# Patient Record
Sex: Female | Born: 1965
Health system: Southern US, Community
[De-identification: ages and names within clinical notes are randomized; demographics above are authoritative.]

## PROBLEM LIST (undated history)

## (undated) DIAGNOSIS — E7800 Pure hypercholesterolemia, unspecified: Secondary | ICD-10-CM

## (undated) DIAGNOSIS — I10 Essential (primary) hypertension: Secondary | ICD-10-CM

## (undated) DIAGNOSIS — M5126 Other intervertebral disc displacement, lumbar region: Secondary | ICD-10-CM

## (undated) DIAGNOSIS — I872 Venous insufficiency (chronic) (peripheral): Secondary | ICD-10-CM

## (undated) DIAGNOSIS — M5136 Other intervertebral disc degeneration, lumbar region: Secondary | ICD-10-CM

## (undated) DIAGNOSIS — E785 Hyperlipidemia, unspecified: Secondary | ICD-10-CM

## (undated) DIAGNOSIS — K219 Gastro-esophageal reflux disease without esophagitis: Secondary | ICD-10-CM

## (undated) DIAGNOSIS — M51369 Other intervertebral disc degeneration, lumbar region without mention of lumbar back pain or lower extremity pain: Secondary | ICD-10-CM

## (undated) DIAGNOSIS — J45909 Unspecified asthma, uncomplicated: Secondary | ICD-10-CM

## (undated) DIAGNOSIS — Z8739 Personal history of other diseases of the musculoskeletal system and connective tissue: Secondary | ICD-10-CM

## (undated) DIAGNOSIS — D219 Benign neoplasm of connective and other soft tissue, unspecified: Secondary | ICD-10-CM

## (undated) HISTORY — DX: Morbid (severe) obesity due to excess calories: E66.01

## (undated) HISTORY — DX: Hyperlipidemia, unspecified: E78.5

## (undated) HISTORY — DX: Pure hypercholesterolemia, unspecified: E78.00

## (undated) HISTORY — DX: Unspecified asthma, uncomplicated: J45.909

## (undated) HISTORY — DX: Essential (primary) hypertension: I10

## (undated) HISTORY — DX: Personal history of other diseases of the musculoskeletal system and connective tissue: Z87.39

## (undated) HISTORY — DX: Other intervertebral disc degeneration, lumbar region: M51.36

## (undated) HISTORY — PX: TONSILLECTOMY: SUR1361

## (undated) HISTORY — DX: Venous insufficiency (chronic) (peripheral): I87.2

## (undated) HISTORY — DX: Other intervertebral disc displacement, lumbar region: M51.26

## (undated) HISTORY — DX: Benign neoplasm of connective and other soft tissue, unspecified: D21.9

## (undated) HISTORY — DX: Other intervertebral disc degeneration, lumbar region without mention of lumbar back pain or lower extremity pain: M51.369

---

## 1998-12-04 ENCOUNTER — Inpatient Hospital Stay (HOSPITAL_COMMUNITY): Admission: AD | Admit: 1998-12-04 | Discharge: 1998-12-06 | Payer: Self-pay | Admitting: Obstetrics & Gynecology

## 1999-01-27 ENCOUNTER — Other Ambulatory Visit: Admission: RE | Admit: 1999-01-27 | Discharge: 1999-01-27 | Payer: Self-pay | Admitting: Obstetrics & Gynecology

## 2000-02-14 ENCOUNTER — Other Ambulatory Visit: Admission: RE | Admit: 2000-02-14 | Discharge: 2000-02-14 | Payer: Self-pay | Admitting: Obstetrics & Gynecology

## 2000-11-22 ENCOUNTER — Other Ambulatory Visit: Admission: RE | Admit: 2000-11-22 | Discharge: 2000-11-22 | Payer: Self-pay | Admitting: Obstetrics & Gynecology

## 2001-06-12 ENCOUNTER — Encounter (INDEPENDENT_AMBULATORY_CARE_PROVIDER_SITE_OTHER): Payer: Self-pay | Admitting: Specialist

## 2001-06-12 ENCOUNTER — Inpatient Hospital Stay (HOSPITAL_COMMUNITY): Admission: AD | Admit: 2001-06-12 | Discharge: 2001-06-15 | Payer: Self-pay | Admitting: Obstetrics & Gynecology

## 2001-07-19 ENCOUNTER — Other Ambulatory Visit: Admission: RE | Admit: 2001-07-19 | Discharge: 2001-07-19 | Payer: Self-pay | Admitting: Obstetrics & Gynecology

## 2002-07-30 ENCOUNTER — Other Ambulatory Visit: Admission: RE | Admit: 2002-07-30 | Discharge: 2002-07-30 | Payer: Self-pay | Admitting: Family Medicine

## 2003-10-08 ENCOUNTER — Other Ambulatory Visit: Admission: RE | Admit: 2003-10-08 | Discharge: 2003-10-08 | Payer: Self-pay | Admitting: Obstetrics and Gynecology

## 2004-03-24 ENCOUNTER — Ambulatory Visit (HOSPITAL_COMMUNITY): Admission: RE | Admit: 2004-03-24 | Discharge: 2004-03-24 | Payer: Self-pay | Admitting: Obstetrics and Gynecology

## 2004-04-08 ENCOUNTER — Ambulatory Visit (HOSPITAL_COMMUNITY): Admission: RE | Admit: 2004-04-08 | Discharge: 2004-04-08 | Payer: Self-pay | Admitting: Obstetrics and Gynecology

## 2004-11-15 ENCOUNTER — Other Ambulatory Visit: Admission: RE | Admit: 2004-11-15 | Discharge: 2004-11-15 | Payer: Self-pay | Admitting: Obstetrics and Gynecology

## 2005-12-01 ENCOUNTER — Ambulatory Visit (HOSPITAL_COMMUNITY): Admission: RE | Admit: 2005-12-01 | Discharge: 2005-12-01 | Payer: Self-pay | Admitting: Obstetrics and Gynecology

## 2005-12-27 ENCOUNTER — Other Ambulatory Visit: Admission: RE | Admit: 2005-12-27 | Discharge: 2005-12-27 | Payer: Self-pay | Admitting: Obstetrics and Gynecology

## 2006-02-16 ENCOUNTER — Encounter (INDEPENDENT_AMBULATORY_CARE_PROVIDER_SITE_OTHER): Payer: Self-pay | Admitting: Specialist

## 2006-02-16 ENCOUNTER — Ambulatory Visit (HOSPITAL_COMMUNITY): Admission: RE | Admit: 2006-02-16 | Discharge: 2006-02-16 | Payer: Self-pay | Admitting: Obstetrics and Gynecology

## 2006-12-20 ENCOUNTER — Encounter: Admission: RE | Admit: 2006-12-20 | Discharge: 2006-12-20 | Payer: Self-pay | Admitting: Obstetrics and Gynecology

## 2008-01-01 ENCOUNTER — Ambulatory Visit (HOSPITAL_COMMUNITY): Admission: RE | Admit: 2008-01-01 | Discharge: 2008-01-01 | Payer: Self-pay | Admitting: Obstetrics and Gynecology

## 2009-03-10 ENCOUNTER — Ambulatory Visit (HOSPITAL_COMMUNITY): Admission: RE | Admit: 2009-03-10 | Discharge: 2009-03-10 | Payer: Self-pay | Admitting: Obstetrics and Gynecology

## 2010-03-23 ENCOUNTER — Ambulatory Visit (HOSPITAL_COMMUNITY)
Admission: RE | Admit: 2010-03-23 | Discharge: 2010-03-23 | Payer: Self-pay | Source: Home / Self Care | Attending: Obstetrics and Gynecology | Admitting: Obstetrics and Gynecology

## 2010-04-06 ENCOUNTER — Encounter
Admission: RE | Admit: 2010-04-06 | Discharge: 2010-04-06 | Payer: Self-pay | Source: Home / Self Care | Attending: Obstetrics and Gynecology | Admitting: Obstetrics and Gynecology

## 2010-08-20 NOTE — Discharge Summary (Signed)
[facility]  Patient:    Natalie Schmitt, Natalie Schmitt Visit Number: [id number] MRN: [id number]          Service Type: OBS Location: 910A [id number] Attending Physician:  Minette Headland Dictated by:   Danie Chandler, R.N. Admit Date:  06/12/2001 Discharge Date: 06/15/2001                             Discharge Summary  ADMITTING DIAGNOSES:          1. Intrauterine pregnancy at term.                               2. Complicated prenatal course.                               3. Surgically scarred uterus from previous                                  cesarean section x2.                               4. Multiparity; request for surgical                                  sterilization.  DISCHARGE DIAGNOSES:          1. Intrauterine pregnancy at term.                               2. Complicated prenatal course.                               3. Surgically scarred uterus from previous                                  cesarean section x2.                               4. Multiparity, request for surgical                                  sterilization.  PROCEDURE:                    On June 12, 2001, repeat low transverse                               cervical cesarean section with delivery of a                               viable _male infant with Apgars of 9 at  one minute and 9 at five minutes and an arterial                               cord pH of 7.33.  SECONDARY PROCEDURES:         Bilateral tubal ligation and myomectomy of an                               approximately 3 cm myoma in the lower uterine                               segment.  REASON FOR ADMISSION:         Please see dictated H&P.  HOSPITAL COURSE:              The patient was taken to the operating room and underwent the above-named procedure without complication.  This was productive of a viable _female infant with Apgars of 9 at one minute and 9 at five  minutes and an arterial cord pH of 7.33.  Postoperatively on day #1 the patients hemoglobin was 9.6, hematocrit 28.4, and white blood cell count 10.0.  On postoperative day #2 the patient was doing well, she was ambulating well without difficulty, tolerating a regular diet and had good return of bowel function.  On postoperative day #3 she was discharged home, she was ambulating well and had good pain control.  CONDITION ON DISCHARGE:       Good.  DIET:                         Regular as tolerated.  ACTIVITY:                     No heavy lifting, no driving, no vaginal entry.  FOLLOWUP:                     She is to follow up in the office in 1-2 weeks for incision check.  DISCHARGE INSTRUCTIONS:       She is to call for temperature greater than 100 degrees, persistent nausea/vomiting, heavy vaginal bleeding and/or redness or drainage from the incision site.  DISCHARGE MEDICATIONS:        1. Prenatal vitamin one p.o. q.d.                               2. Percocet 5 mg #20 as directed by MD. Dictated by:   Danie Chandler, R.N. Attending Physician:  Minette Headland DD:  06/15/01 TD:  06/16/01 Job: [id number]

## 2010-08-20 NOTE — Op Note (Signed)
[facility]  Patient:    Natalie Schmitt, Natalie Schmitt Visit Number: [id number] MRN: [id number]          Service Type: Attending:  Freddy Finner, M.D. Dictated by:   Freddy Finner, M.D. Proc. Date: 06/12/01                             Operative Report  PREOPERATIVE DIAGNOSES:       Intrauterine pregnancy at term, uncomplicated prenatal course, surgically scarred uterus from previous cesarean section x2, multiparity, request for surgical sterilization.  OPERATIVE PROCEDURE:          Repeat low transverse cervical cesarean section with delivery of viable female infant.  Apgars of 9 and 9.  Birth weight 9 pounds 7 ounces.  Cord pH 7.33.  Secondary procedures:  Bilateral tubal ligation and myomectomy of an approximately 3 cm myoma in the lower uterine segment.  SURGEON:                      Freddy Finner, M.D.  ANESTHESIA:                   General.  INTRAOPERATIVE COMPLICATIONS: None.  ESTIMATED BLOOD LOSS:         600-800 cc.  PROCEDURE:                    Patient is a 45 year old white married female who is now at term with a third pregnancy.  She has previously delivered by cesarean on two occasions.  She is admitted now for repeat cesarean delivery.  She was admitted on the morning of surgery, brought to the operating room, there placed under adequate spinal anesthesia, placed in the dorsal recumbent position with elevation of right hip about 15 degrees.  Abdomen was prepped with Betadine solution.  Foley catheter was placed using sterile technique. Sterile drapes were applied.  ______ a lower abdominal transverse incision was made carried sharply down the fascia.  Fascia was entered sharply and extended to the extent of the skin incision.  Rectus sheath was developed superiorly and inferiorly with blunt and sharp dissection.  Rectus muscles were divided in the midline.  Peritoneal was entered in the process of opening the rectus muscles.  This was  extended bluntly and sharply to the extent of the skin incision.  Bladder blade was placed.  ______ peritoneum umbilical lower uterine segment a small area of the bladder was dissected free. Transverse incision ______ in the lower segment extended bluntly in a transverse regimen.  Amniotomy produced clear fluid.  A vacuum extractor was then required for easy delivery of a viable female.  Apgars and pH are noted above.  The placenta was removed and the cavity confirmed evacuated by manual exploration of the uterine cavity.  Uterus was delivered through the vaginal introitus.  There were a total of three visible myomas.  Two were very small and subserosal, one posteriorly, and one anteriorly on the left lower uterine segment.  A large 3 cm myoma was located just above the incision in the lower segment.  It was elected to excise it sharply.  The uterine incision was then closed in a running layer of 0 Monocryl in a locking pattern.  An interrupted was required at the left margin for complete hemostasis.  Mid portion of each tube was then elevated, double ligated with free ties of 0 plain, and approximately  1.5 cm of tube excised.  The distal tips of the tubes distal tube ties were fulgurated with the Bovie.  The uterus was then placed back in the abdominal cavity.  Please note the ovaries were completely normal.  The right fallopian tube was avulsed at the tie.  Replacement of the uterus within the abdominal cavity and each of these was then religated ______ with 0 plain ______ suture ligature of 0 Monocryl.  Bovie of the mesosalpinx produced complete hemostasis.  Irrigation was carried out.  All packs, needles, and instruments removed.  Counts were correct.  Abdominal incision was then closed in layers.  Running 0 Monocryl was used for ______  and to reapproximate the rectus muscles.  Fascia was closed with running 0 PDS.  Skin was closed with wide skin staples and covered in Steri-Strips.   Estimated intraoperative blood loss was 600-800 cc.  Intraoperative complications:  None.  Patient was taken to recovery in good condition. Dictated by:   Freddy Finner, M.D. Attending:  Freddy Finner, M.D. DD:  06/12/01 TD:  06/12/01 Job: [id number]

## 2010-08-20 NOTE — H&P (Signed)
[facility]  Patient:    Natalie Schmitt, Natalie Schmitt Visit Number: [id number] MRN: [id number]          Service Type: Attending:  Freddy Finner, M.D. Dictated by:   Freddy Finner, M.D. Adm. Date:  06/12/01                           History and Physical  ADMITTING DIAGNOSES:          1. Intrauterine pregnancy at term.                               2. Surgically scarred uterus from two previous                                  cesareans.                               3. Multiparity and request for surgical                                  sterilization.  HISTORY OF PRESENT ILLNESS:   The patient is a 45 year old white married female, gravida 4, para 2, both by cesarean, who is now at term with an uncomplicated prenatal course and is admitted now for cesarean delivery.  This will be her third viable pregnancy, and she has requested permanent surgical intervention.  REVIEW OF SYSTEMS:            Her current review of systems is negative. There are no cardiopulmonary, GI, or GU complaints.  PAST MEDICAL HISTORY:         Recorded in detail in the prenatal summary.  It will not be repeated at this time.  PHYSICAL EXAMINATION:  GENERAL:                      The patient is a well-nourished, well-developed, alert, pleasant white female in no acute distress.  VITAL SIGNS:                  Her blood pressure in the office was 124/70.  CHEST:                        Clear to auscultation.  HEART:                        Normal sinus rhythm with murmur, rub or gallop.  ABDOMEN:                      Gravid, estimated fetal weight of 7.5-8 pounds. Positive fetal heart tone in the left lower quadrant.  CERVIX:                       Closed.  EXTREMITIES:                  Without clubbing, cyanosis, or edema.  ASSESSMENT:                   Uncomplicated prenatal course at term for cesarean birth.  PLAN:  1. Repeat low transverse cesarean  section.                               2. Bilateral tubal ligation. Dictated by:   Freddy Finner, M.D. Attending:  Freddy Finner, M.D. DD:  06/11/01 TD:  06/11/01 Job: [id number]

## 2010-08-20 NOTE — Op Note (Signed)
NAME:  Natalie Schmitt, Natalie Schmitt               ACCOUNT NO.:  [id number]   MEDICAL RECORD NO.:  [id number]          PATIENT TYPE:  AMB   LOCATION:  SDC                           FACILITY:  [facility]   PHYSICIAN:  Hal Morales, M.D.DATE OF BIRTH:  14-Mar-1966   DATE OF PROCEDURE:  02/16/2006  DATE OF DISCHARGE:                               OPERATIVE REPORT   PREOPERATIVE DIAGNOSES:  1. Metrorrhagia.  2. History of fibroids.  3. Questionable polyp on ultrasound.   POSTOPERATIVE DIAGNOSES:  Metrorrhagia and history of fibroids.   PROCEDURE:  Hysteroscopy D&C.   SURGEON:  Dr. Dierdre Forth   ANESTHESIA:  General LMA.   SPECIMENS TO PATHOLOGY:  Endometrial curettings.   ESTIMATED BLOOD LOSS:  Less than 10 mL.   COMPLICATIONS:  None.   FINDINGS:  The uterus sounded to approximately 9 cm and was markedly  retroverted.  The endometrial cavity appeared slightly atrophic with 1  of fluffy endometrium at the fundus.  There were no actual fibroids  noted submucosally and no clearly identifiable polypoid lesion other  than the fluffy endometrium.   PROCEDURE:  The patient was taken to the operating room after  appropriate identification and placed on the operating table.  After the  attainment of adequate general anesthesia, she was placed in lithotomy  position.  The perineum and vagina were prepped with multiple layers of  Betadine and bladder emptied with an in-and-out catheter.  The perineum  was draped as a sterile field.  A Graves speculum was placed in the  vagina and a paracervical block achieved with a total of 10 mL of 2%  Xylocaine in the 5 and 7 o'clock positions.  A single-tooth tenaculum  was placed on the anterior cervix.  The uterus was sounded.  The cervix  was dilated to accommodate the diagnostic hysteroscope which was then  used to evaluate all quadrants of the uterus.  The endometrium was  sharply curetted in all quadrants and the tissue removed.  Review of the  endometrial  cavity revealed no further lesions, and  all instruments were removed from the vagina.  The patient was then  awakened from general anesthesia and taken to the recovery room in  satisfactory condition having tolerated the procedure well with sponge  and instrument counts correct.  Fluid deficit was 100 mL.      Hal Morales, M.D.  Electronically Signed     VPH/MEDQ  D:  02/16/2006  T:  02/16/2006  Job:  [id number]

## 2011-03-15 ENCOUNTER — Other Ambulatory Visit (HOSPITAL_COMMUNITY): Payer: Self-pay | Admitting: Obstetrics and Gynecology

## 2011-03-15 DIAGNOSIS — Z1231 Encounter for screening mammogram for malignant neoplasm of breast: Secondary | ICD-10-CM

## 2011-04-05 DIAGNOSIS — I10 Essential (primary) hypertension: Secondary | ICD-10-CM

## 2011-04-05 HISTORY — DX: Essential (primary) hypertension: I10

## 2011-04-21 ENCOUNTER — Ambulatory Visit (HOSPITAL_COMMUNITY)
Admission: RE | Admit: 2011-04-21 | Discharge: 2011-04-21 | Disposition: A | Discharge status: Home / Self Care | Payer: BC Managed Care – PPO | Source: Ambulatory Visit | Attending: Obstetrics and Gynecology | Admitting: Obstetrics and Gynecology

## 2011-04-21 DIAGNOSIS — Z1231 Encounter for screening mammogram for malignant neoplasm of breast: Secondary | ICD-10-CM | POA: Insufficient documentation

## 2012-03-21 ENCOUNTER — Ambulatory Visit: Payer: BC Managed Care – PPO | Admitting: Obstetrics and Gynecology

## 2012-03-27 ENCOUNTER — Other Ambulatory Visit: Payer: Self-pay | Admitting: Obstetrics and Gynecology

## 2012-03-27 DIAGNOSIS — Z1231 Encounter for screening mammogram for malignant neoplasm of breast: Secondary | ICD-10-CM

## 2012-04-02 ENCOUNTER — Ambulatory Visit (INDEPENDENT_AMBULATORY_CARE_PROVIDER_SITE_OTHER): Payer: BC Managed Care – PPO | Admitting: Obstetrics and Gynecology

## 2012-04-02 ENCOUNTER — Encounter: Payer: Self-pay | Admitting: Obstetrics and Gynecology

## 2012-04-02 VITALS — BP 110/80 | HR 72 | Resp 16 | Ht 64.0 in | Wt 208.0 lb

## 2012-04-02 DIAGNOSIS — D259 Leiomyoma of uterus, unspecified: Secondary | ICD-10-CM

## 2012-04-02 DIAGNOSIS — Z01419 Encounter for gynecological examination (general) (routine) without abnormal findings: Secondary | ICD-10-CM

## 2012-04-02 DIAGNOSIS — D219 Benign neoplasm of connective and other soft tissue, unspecified: Secondary | ICD-10-CM

## 2012-04-02 DIAGNOSIS — Z8742 Personal history of other diseases of the female genital tract: Secondary | ICD-10-CM

## 2012-04-02 DIAGNOSIS — Z124 Encounter for screening for malignant neoplasm of cervix: Secondary | ICD-10-CM

## 2012-04-02 NOTE — Progress Notes (Signed)
Last Pap: 02/20/2009  WNL: Yes Regular Periods:yes Contraception: BTL  Monthly Breast exam:no Tetanus<66yrs:yes Nl.Bladder Function:yes Daily BMs:yes Healthy Diet:no Calcium:no Mammogram:yes Date of Mammogram:04/21/2011 Exercise:yes Have often Exercise: 4 x wkly Seatbelt: yes Abuse at home: no Stressful work:yes Sigmoid-colonoscopy: None Bone Density: No PCP: D. Swayne Change in PMH: None Change in QMV:HQIO  Subjective:    Natalie Schmitt is a 46 y.o. female,  who presents for an annual exam. C/o right arm pain and weakness since late Nov.    History   Social History  . Marital Status: Married    Spouse Name: N/A    Number of Children: N/A  . Years of Education: N/A   Social History Main Topics  . Smoking status: Former Smoker    Types: Cigarettes    Quit date: 04/03/1991  . Smokeless tobacco: None  . Alcohol Use: Yes  . Drug Use: No  . Sexually Active: Yes     Comment: BTL   Other Topics Concern  . None   Social History Narrative  . None    Menstrual cycle:   LMP: Patient's last menstrual period was 02/16/2012.           Cycle: Cycles over the last year have been erratic from every 18-45 days, some very heavy, most recently in 11/13, and with cramps and clots.  Some hot flashes and insomnia.  The following portions of the patient's history were reviewed and updated as appropriate: allergies, current medications, past family history, past medical history, past social history, past surgical history and problem list.  Review of Systems Pertinent items are noted in HPI. Breast:Negative for breast lump,nipple discharge or nipple retraction Gastrointestinal: Negative for abdominal pain, change in bowel habits or rectal bleeding Urinary:negative   Objective:    BP 110/80  Pulse 72  Resp 16  Ht 5\' 4"  (1.626 m)  Wt 208 lb (94.348 kg)  BMI 35.70 kg/m2  LMP 02/16/2012    Weight:  Wt Readings from Last 1 Encounters:  04/02/12 208 lb (94.348 kg)   BMI: Body mass index is 35.70 kg/(m^2).  General Appearance: Alert, appropriate appearance for age. No acute distress HEENT: Grossly normal Neck / Thyroid: Supple, no masses, nodes or enlargement Lungs: clear to auscultation bilaterally Back: No CVA tenderness Breast Exam: No masses or nodes.No dimpling, nipple retraction or discharge. Cardiovascular: Regular rate and rhythm. S1, S2, no murmur Gastrointestinal: Soft, non-tender, no masses or organomegaly Pelvic Exam: External genitalia: normal general appearance Vaginal: normal rugae Cervix: normal appearance Adnexa: No masses noted Uterus: irregular enlargement  About 10 weeks size posterior and mobile Exam limited by body habitus Rectovaginal: normal rectal, no masses Lymphatic Exam: Non-palpable nodes in neck, clavicular, axillary, or inguinal regions Skin: no rash or abnormalities Neurologic: Normal gait and speech, no tremor  Psychiatric: Alert and oriented, appropriate affect.   Wet Prep:not applicable Urinalysis:not applicable UPT: Not done   Assessment:  Perimenopausal bleeding pattern  Fibroids  Episodic menorrhagia and dysmenorrhea   Plan:    mammogram pap smear return annually  STD screening: declined Contraception:bilateral tubal ligation Call for endometrial biopsy if menses less than 21 days cycle. Ibuprofen OTC 600mg  q 6h prm cramps or heavy bleeding Referral to orthopedist for right arm  Dierdre Forth MD

## 2012-04-05 LAB — PAP IG AND HPV HIGH-RISK: HPV DNA High Risk: NOT DETECTED

## 2012-04-24 ENCOUNTER — Ambulatory Visit (HOSPITAL_COMMUNITY)
Admission: RE | Admit: 2012-04-24 | Discharge: 2012-04-24 | Disposition: A | Discharge status: Home / Self Care | Payer: BC Managed Care – PPO | Source: Ambulatory Visit | Attending: Obstetrics and Gynecology | Admitting: Obstetrics and Gynecology

## 2012-04-24 DIAGNOSIS — Z1231 Encounter for screening mammogram for malignant neoplasm of breast: Secondary | ICD-10-CM

## 2012-04-29 ENCOUNTER — Encounter: Payer: Self-pay | Admitting: Obstetrics and Gynecology

## 2012-04-29 DIAGNOSIS — R9230 Dense breasts, unspecified: Secondary | ICD-10-CM | POA: Insufficient documentation

## 2012-04-29 DIAGNOSIS — R922 Inconclusive mammogram: Secondary | ICD-10-CM | POA: Insufficient documentation

## 2013-03-12 ENCOUNTER — Ambulatory Visit (INDEPENDENT_AMBULATORY_CARE_PROVIDER_SITE_OTHER): Payer: BC Managed Care – PPO

## 2013-03-12 ENCOUNTER — Ambulatory Visit (INDEPENDENT_AMBULATORY_CARE_PROVIDER_SITE_OTHER): Payer: BC Managed Care – PPO | Admitting: Podiatry

## 2013-03-12 DIAGNOSIS — M79672 Pain in left foot: Secondary | ICD-10-CM

## 2013-03-12 DIAGNOSIS — M79609 Pain in unspecified limb: Secondary | ICD-10-CM

## 2013-03-12 DIAGNOSIS — M722 Plantar fascial fibromatosis: Secondary | ICD-10-CM

## 2013-03-12 MED ORDER — MELOXICAM 15 MG PO TABS: 15.0000 mg | ORAL_TABLET | Freq: Every day | ORAL | Status: DC

## 2013-03-12 MED ORDER — METHYLPREDNISOLONE (PAK) 4 MG PO TABS: ORAL_TABLET | ORAL | Status: DC

## 2013-03-12 NOTE — Patient Instructions (Addendum)
Plantar Fasciitis (Heel Spur Syndrome) with Rehab The plantar fascia is a fibrous, ligament-like, soft-tissue structure that spans the bottom of the foot. Plantar fasciitis is a condition that causes pain in the foot due to inflammation of the tissue. SYMPTOMS   Pain and tenderness on the underneath side of the foot.  Pain that worsens with standing or walking. CAUSES  Plantar fasciitis is caused by irritation and injury to the plantar fascia on the underneath side of the foot. Common mechanisms of injury include:  Direct trauma to bottom of the foot.  Damage to a small nerve that runs under the foot where the main fascia attaches to the heel bone.  Stress placed on the plantar fascia due to bone spurs. RISK INCREASES WITH:   Activities that place stress on the plantar fascia (running, jumping, pivoting, or cutting).  Poor strength and flexibility.  Improperly fitted shoes.  Tight calf muscles.  Flat feet.  Failure to warm-up properly before activity.  Obesity. PREVENTION  Warm up and stretch properly before activity.  Allow for adequate recovery between workouts.  Maintain physical fitness:  Strength, flexibility, and endurance.  Cardiovascular fitness.  Maintain a health body weight.  Avoid stress on the plantar fascia.  Wear properly fitted shoes, including arch supports for individuals who have flat feet. PROGNOSIS  If treated properly, then the symptoms of plantar fasciitis usually resolve without surgery. However, occasionally surgery is necessary. RELATED COMPLICATIONS   Recurrent symptoms that may result in a chronic condition.  Problems of the lower back that are caused by compensating for the injury, such as limping.  Pain or weakness of the foot during push-off following surgery.  Chronic inflammation, scarring, and partial or complete fascia tear, occurring more often from repeated injections. TREATMENT  Treatment initially involves the use of  ice and medication to help reduce pain and inflammation. The use of strengthening and stretching exercises may help reduce pain with activity, especially stretches of the Achilles tendon. These exercises may be performed at home or with a therapist. Your caregiver may recommend that you use heel cups of arch supports to help reduce stress on the plantar fascia. Occasionally, corticosteroid injections are given to reduce inflammation. If symptoms persist for greater than 6 months despite non-surgical (conservative), then surgery may be recommended.  MEDICATION   If pain medication is necessary, then nonsteroidal anti-inflammatory medications, such as aspirin and ibuprofen, or other minor pain relievers, such as acetaminophen, are often recommended.  Do not take pain medication within 7 days before surgery.  Prescription pain relievers may be given if deemed necessary by your caregiver. Use only as directed and only as much as you need.  Corticosteroid injections may be given by your caregiver. These injections should be reserved for the most serious cases, because they may only be given a certain number of times. HEAT AND COLD  Cold treatment (icing) relieves pain and reduces inflammation. Cold treatment should be applied for 10 to 15 minutes every 2 to 3 hours for inflammation and pain and immediately after any activity that aggravates your symptoms. Use ice packs or massage the area with a piece of ice (ice massage).  Heat treatment may be used prior to performing the stretching and strengthening activities prescribed by your caregiver, physical therapist, or athletic trainer. Use a heat pack or soak the injury in warm water. SEEK IMMEDIATE MEDICAL CARE IF:  Treatment seems to offer no benefit, or the condition worsens.  Any medications produce adverse side effects. EXERCISES RANGE   OF MOTION (ROM) AND STRETCHING EXERCISES - Plantar Fasciitis (Heel Spur Syndrome) These exercises may help you  when beginning to rehabilitate your injury. Your symptoms may resolve with or without further involvement from your physician, physical therapist or athletic trainer. While completing these exercises, remember:   Restoring tissue flexibility helps normal motion to return to the joints. This allows healthier, less painful movement and activity.  An effective stretch should be held for at least 30 seconds.  A stretch should never be painful. You should only feel a gentle lengthening or release in the stretched tissue. RANGE OF MOTION - Toe Extension, Flexion  Sit with your right / left leg crossed over your opposite knee.  Grasp your toes and gently pull them back toward the top of your foot. You should feel a stretch on the bottom of your toes and/or foot.  Hold this stretch for __________ seconds.  Now, gently pull your toes toward the bottom of your foot. You should feel a stretch on the top of your toes and or foot.  Hold this stretch for __________ seconds. Repeat __________ times. Complete this stretch __________ times per day.  RANGE OF MOTION - Ankle Dorsiflexion, Active Assisted  Remove shoes and sit on a chair that is preferably not on a carpeted surface.  Place right / left foot under knee. Extend your opposite leg for support.  Keeping your heel down, slide your right / left foot back toward the chair until you feel a stretch at your ankle or calf. If you do not feel a stretch, slide your bottom forward to the edge of the chair, while still keeping your heel down.  Hold this stretch for __________ seconds. Repeat __________ times. Complete this stretch __________ times per day.  STRETCH  Gastroc, Standing  Place hands on wall.  Extend right / left leg, keeping the front knee somewhat bent.  Slightly point your toes inward on your back foot.  Keeping your right / left heel on the floor and your knee straight, shift your weight toward the wall, not allowing your back to  arch.  You should feel a gentle stretch in the right / left calf. Hold this position for __________ seconds. Repeat __________ times. Complete this stretch __________ times per day. STRETCH  Soleus, Standing  Place hands on wall.  Extend right / left leg, keeping the other knee somewhat bent.  Slightly point your toes inward on your back foot.  Keep your right / left heel on the floor, bend your back knee, and slightly shift your weight over the back leg so that you feel a gentle stretch deep in your back calf.  Hold this position for __________ seconds. Repeat __________ times. Complete this stretch __________ times per day. STRETCH  Gastrocsoleus, Standing  Note: This exercise can place a lot of stress on your foot and ankle. Please complete this exercise only if specifically instructed by your caregiver.   Place the ball of your right / left foot on a step, keeping your other foot firmly on the same step.  Hold on to the wall or a rail for balance.  Slowly lift your other foot, allowing your body weight to press your heel down over the edge of the step.  You should feel a stretch in your right / left calf.  Hold this position for __________ seconds.  Repeat this exercise with a slight bend in your right / left knee. Repeat __________ times. Complete this stretch __________ times per day.    STRENGTHENING EXERCISES - Plantar Fasciitis (Heel Spur Syndrome)  These exercises may help you when beginning to rehabilitate your injury. They may resolve your symptoms with or without further involvement from your physician, physical therapist or athletic trainer. While completing these exercises, remember:   Muscles can gain both the endurance and the strength needed for everyday activities through controlled exercises.  Complete these exercises as instructed by your physician, physical therapist or athletic trainer. Progress the resistance and repetitions only as guided. STRENGTH - Towel  Curls  Sit in a chair positioned on a non-carpeted surface.  Place your foot on a towel, keeping your heel on the floor.  Pull the towel toward your heel by only curling your toes. Keep your heel on the floor.  If instructed by your physician, physical therapist or athletic trainer, add ____________________ at the end of the towel. Repeat __________ times. Complete this exercise __________ times per day. STRENGTH - Ankle Inversion  Secure one end of a rubber exercise band/tubing to a fixed object (table, pole). Loop the other end around your foot just before your toes.  Place your fists between your knees. This will focus your strengthening at your ankle.  Slowly, pull your big toe up and in, making sure the band/tubing is positioned to resist the entire motion.  Hold this position for __________ seconds.  Have your muscles resist the band/tubing as it slowly pulls your foot back to the starting position. Repeat __________ times. Complete this exercises __________ times per day.  Document Released: 03/21/2005 Document Revised: 06/13/2011 Document Reviewed: 07/03/2008 [employer] Patient Information 2014 [employer]. Plantar Fasciitis Plantar fasciitis is a common condition that causes foot pain. It is soreness (inflammation) of the band of tough fibrous tissue on the bottom of the foot that runs from the heel bone (calcaneus) to the ball of the foot. The cause of this soreness may be from excessive standing, poor fitting shoes, running on hard surfaces, being overweight, having an abnormal walk, or overuse (this is common in runners) of the painful foot or feet. It is also common in aerobic exercise dancers and ballet dancers. SYMPTOMS  Most people with plantar fasciitis complain of:  Severe pain in the morning on the bottom of their foot especially when taking the first steps out of bed. This pain recedes after a few minutes of walking.  Severe pain is experienced also during walking  following a long period of inactivity.  Pain is worse when walking barefoot or up stairs DIAGNOSIS   Your caregiver will diagnose this condition by examining and feeling your foot.  Special tests such as X-rays of your foot, are usually not needed. PREVENTION   Consult a sports medicine professional before beginning a new exercise program.  Walking programs offer a good workout. With walking there is a lower chance of overuse injuries common to runners. There is less impact and less jarring of the joints.  Begin all new exercise programs slowly. If problems or pain develop, decrease the amount of time or distance until you are at a comfortable level.  Wear good shoes and replace them regularly.  Stretch your foot and the heel cords at the back of the ankle (Achilles tendon) both before and after exercise.  Run or exercise on even surfaces that are not hard. For example, asphalt is better than pavement.  Do not run barefoot on hard surfaces.  If using a treadmill, vary the incline.  Do not continue to workout if you have foot or joint   problems. Seek professional help if they do not improve. HOME CARE INSTRUCTIONS   Avoid activities that cause you pain until you recover.  Use ice or cold packs on the problem or painful areas after working out.  Only take over-the-counter or prescription medicines for pain, discomfort, or fever as directed by your caregiver.  Soft shoe inserts or athletic shoes with air or gel sole cushions may be helpful.  If problems continue or become more severe, consult a sports medicine caregiver or your own health care provider. Cortisone is a potent anti-inflammatory medication that may be injected into the painful area. You can discuss this treatment with your caregiver. MAKE SURE YOU:   Understand these instructions.  Will watch your condition.  Will get help right away if you are not doing well or get worse. Document Released: 12/14/2000 Document  Revised: 06/13/2011 Document Reviewed: 02/13/2008 [employer] Patient Information 2014 [employer].  

## 2013-03-13 NOTE — Progress Notes (Signed)
Natalie Schmitt states that her left foot is been bothering her since September when she fell on it she fell backwards on it pressing it to the ground. She thought it was her plantar fasciitis acting up.  Objective: Vital signs are stable she is alert and oriented x3. Pulses remain palpable left lower extremity. She has pain on palpation to the fourth fifth met cuboid articulation. She has pain on palpation medial continued tubercle of the left heel. Rate graphic evaluation does demonstrate soft tissue increase in density at the plantar fascial calcaneal insertion site but no other osseous abnormalities.  Assessment: Capsulitis associated with lateral compensatory syndrome secondary to plantar fasciitis left.  Plan: Steroids to be followed by nonsteroidals injected Kenalog and local anesthetic. We put her in a plantar fascial brace I will followup with her in one month

## 2013-04-08 ENCOUNTER — Other Ambulatory Visit: Payer: Self-pay | Admitting: Obstetrics and Gynecology

## 2013-04-08 DIAGNOSIS — Z1231 Encounter for screening mammogram for malignant neoplasm of breast: Secondary | ICD-10-CM

## 2013-04-09 ENCOUNTER — Ambulatory Visit (INDEPENDENT_AMBULATORY_CARE_PROVIDER_SITE_OTHER): Payer: BC Managed Care – PPO | Admitting: Podiatry

## 2013-04-09 ENCOUNTER — Encounter: Payer: Self-pay | Admitting: Podiatry

## 2013-04-09 VITALS — BP 172/96 | HR 97 | Resp 16

## 2013-04-09 DIAGNOSIS — M778 Other enthesopathies, not elsewhere classified: Secondary | ICD-10-CM

## 2013-04-09 DIAGNOSIS — M7750 Other enthesopathy of unspecified foot: Secondary | ICD-10-CM

## 2013-04-09 DIAGNOSIS — M779 Enthesopathy, unspecified: Principal | ICD-10-CM

## 2013-04-09 DIAGNOSIS — M722 Plantar fascial fibromatosis: Secondary | ICD-10-CM

## 2013-04-09 NOTE — Progress Notes (Signed)
She presents today for followup of her plantar fasciitis and capsulitis of the second metatarsophalangeal joint. She denies fever chills nausea vomiting muscle aches and pains. Continues all conservative therapies. States that she's considerably better.  Objective: Vital signs are stable she is alert and oriented x3 no pain on palpation medial continued tubercles.  Assessment: Well-healing inflammatory response with plantar fasciitis and capsulitis.  Plan: Continue all conservative therapies x1 month and discontinue gradually.

## 2013-04-30 ENCOUNTER — Ambulatory Visit (HOSPITAL_COMMUNITY)
Admission: RE | Admit: 2013-04-30 | Discharge: 2013-04-30 | Disposition: A | Discharge status: Home / Self Care | Payer: BC Managed Care – PPO | Source: Ambulatory Visit | Attending: Obstetrics and Gynecology | Admitting: Obstetrics and Gynecology

## 2013-04-30 DIAGNOSIS — Z1231 Encounter for screening mammogram for malignant neoplasm of breast: Secondary | ICD-10-CM | POA: Insufficient documentation

## 2013-10-30 ENCOUNTER — Other Ambulatory Visit: Payer: Self-pay | Admitting: Obstetrics and Gynecology

## 2014-01-22 ENCOUNTER — Other Ambulatory Visit: Payer: Self-pay | Admitting: Obstetrics and Gynecology

## 2014-02-04 ENCOUNTER — Other Ambulatory Visit: Payer: Self-pay | Admitting: Obstetrics and Gynecology

## 2014-02-04 NOTE — H&P (Signed)
Natalie Schmitt is a 48 y.o.  female P: 3-0-1-3 for hysterectomy because of worsening irregular vaginal bleeding.  For over 3 years patient describes her menstrual flow as "untimely" in that she never knows when to expect her menses. Her flow ranges from 1.5 days to 3 weeks with the need to change an ultra super tampon with pad every 2 hours due to clots.  She also reports cramping that is rated at 7/10 on a 10 point pain scale but finds relief with Advil 600 mg.  She denies post coital bleeding, dysparuenia, changes in bladder function or problems with bowel function.  An endometrial biopsy done July 2015 was benign and sono-hysterogram at that same time showed: a linear focus in the posterior endometrium 2.6 x 0.88 x 0.62 cm with a single vessel source, resembling a polyp; uterus: 6.62 x 6.19 x 5.38 cm (retroflexed) with an endometrium: 5.44 mm;  #2 intra-mural fibroids: anterior right: 1.2 cm and posterior: 1.2 cm.  A review of both medical and surgical management options were given to the patient however she desires definitive therapy in the form of hysterectomy.   Past Medical History  OB History: G:4;  P: 3-0-1-3;  C-sections 1992., 2001, and 2003  GYN History: menarche: 48 YO    LMP: 12/28/2013    Contracepton bilateral tubal ligation  The patient denies history of sexually transmitted disease.  Remote history of abnormal PAP smear.  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   ROS: Admits to contact lenses, back/feet/hand arthritis but denies headache, vision changes, nasal congestion, dysphagia, tinnitus, dizziness, hoarseness, cough,  chest pain, shortness of breath, nausea, vomiting, diarrhea,constipation,  urinary frequency, urgency  dysuria, hematuria, vaginitis symptoms, pelvic pain, swelling of joints,easy bruising,  myalgias,  skin rashes, unexplained weight loss and except as is mentioned in the history of present illness, patient's review of systems is otherwise negative.   Physical Exam  Bp: 130.90  P: 66   R: 16  Temperature: 98.4 degrees F orally  Weight: 219.5 lbs.  Height: 5'5"   BMI: 36.5  Neck: supple without masses or thyromegaly Lungs: clear to auscultation Heart: regular rate and rhythm Abdomen: soft, non-tender and no organomegaly Pelvic:EGBUS- wnl; vagina-normal rugae; uterus-normal size, cervix without lesions or motion tenderness; adnexae-no tenderness or masses Extremities:  no clubbing, cyanosis or edema   Assesment: Irregular Bleeding           Fibroids           Endometrial Mass   Disposition:  A discussion was held with patient regarding the indication for her procedure(s) along with the risks, which include but are not limited to: reaction to anesthesia, damage to adjacent organs, infection and excessive bleeding. The patient verbalized understanding of these risks and has consented to proceed with a Laparoscopically Assisted Vaginal  Hysterectomy with Bilateral Salpingectomy and Possible Total Abdominal Hysterectomy at [facility] on March 05, 2014 at 7:30 a.m.    CSN# [id number]   Kristofer Schaffert J. Florene Glen, PA-C  for Dr. Seymour Bars. Haygood

## 2014-02-28 ENCOUNTER — Encounter (HOSPITAL_COMMUNITY): Payer: Self-pay

## 2014-02-28 ENCOUNTER — Encounter (HOSPITAL_COMMUNITY)
Admission: RE | Admit: 2014-02-28 | Discharge: 2014-02-28 | Disposition: A | Discharge status: Home / Self Care | Payer: BC Managed Care – PPO | Source: Ambulatory Visit | Attending: Obstetrics and Gynecology | Admitting: Obstetrics and Gynecology

## 2014-02-28 DIAGNOSIS — Z01812 Encounter for preprocedural laboratory examination: Secondary | ICD-10-CM | POA: Insufficient documentation

## 2014-02-28 HISTORY — DX: Essential (primary) hypertension: I10

## 2014-02-28 HISTORY — DX: Gastro-esophageal reflux disease without esophagitis: K21.9

## 2014-02-28 LAB — CBC
HEMATOCRIT: 41.2 % (ref 36.0–46.0)
HEMOGLOBIN: 13.8 g/dL (ref 12.0–15.0)
MCH: 28.6 pg (ref 26.0–34.0)
MCHC: 33.5 g/dL (ref 30.0–36.0)
MCV: 85.5 fL (ref 78.0–100.0)
Platelets: 325 10*3/uL (ref 150–400)
RBC: 4.82 MIL/uL (ref 3.87–5.11)
RDW: 14.6 % (ref 11.5–15.5)
WBC: 6.3 10*3/uL (ref 4.0–10.5)

## 2014-02-28 NOTE — Patient Instructions (Addendum)
Your procedure is scheduled on:03/05/14  Enter through the Main Entrance at : 6am Pick up desk phone and dial [phone] and inform us of your arrival.  Please call [phone] if you have any problems the morning of surgery.  Remember: Do not eat food or drink liquids, including water, after midnight:Tuesday   You may brush your teeth the morning of surgery.  Take these meds the morning of surgery with a sip of water: Prilosec  DO NOT wear jewelry, eye make-up, lipstick,body lotion, or dark fingernail polish.  (Polished toes are ok) You may wear deodorant.  If you are to be admitted after surgery, leave suitcase in car until your room has been assigned. Patients discharged on the day of surgery will not be allowed to drive home. Wear loose fitting, comfortable clothes for your ride home.

## 2014-02-28 NOTE — Pre-Procedure Instructions (Signed)
Dr Jillyn Hidden notified of pt's high BP readings during PAT appt.

## 2014-03-04 MED ORDER — DEXTROSE 5 % IV SOLN
2.0000 g | INTRAVENOUS | Status: AC
  Administered 2014-03-05: 2 g via INTRAVENOUS
  Filled 2014-03-04: qty 2

## 2014-03-05 ENCOUNTER — Ambulatory Visit (HOSPITAL_COMMUNITY): Payer: BC Managed Care – PPO | Admitting: Anesthesiology

## 2014-03-05 ENCOUNTER — Encounter (HOSPITAL_COMMUNITY): Payer: Self-pay | Admitting: Anesthesiology

## 2014-03-05 ENCOUNTER — Encounter (HOSPITAL_COMMUNITY)
Admission: RE | Disposition: A | Discharge status: Home / Self Care | Payer: Self-pay | Source: Ambulatory Visit | Attending: Obstetrics and Gynecology

## 2014-03-05 ENCOUNTER — Observation Stay (HOSPITAL_COMMUNITY)
Admission: RE | Admit: 2014-03-05 | Discharge: 2014-03-06 | Disposition: A | Discharge status: Home / Self Care | Payer: BC Managed Care – PPO | Source: Ambulatory Visit | Attending: Obstetrics and Gynecology | Admitting: Obstetrics and Gynecology

## 2014-03-05 DIAGNOSIS — N800 Endometriosis of uterus: Secondary | ICD-10-CM | POA: Diagnosis not present

## 2014-03-05 DIAGNOSIS — Z87891 Personal history of nicotine dependence: Secondary | ICD-10-CM | POA: Insufficient documentation

## 2014-03-05 DIAGNOSIS — N926 Irregular menstruation, unspecified: Secondary | ICD-10-CM | POA: Diagnosis present

## 2014-03-05 DIAGNOSIS — I10 Essential (primary) hypertension: Secondary | ICD-10-CM | POA: Insufficient documentation

## 2014-03-05 DIAGNOSIS — R030 Elevated blood-pressure reading, without diagnosis of hypertension: Secondary | ICD-10-CM

## 2014-03-05 DIAGNOSIS — N840 Polyp of corpus uteri: Secondary | ICD-10-CM | POA: Diagnosis not present

## 2014-03-05 DIAGNOSIS — D259 Leiomyoma of uterus, unspecified: Secondary | ICD-10-CM | POA: Insufficient documentation

## 2014-03-05 DIAGNOSIS — IMO0001 Reserved for inherently not codable concepts without codable children: Secondary | ICD-10-CM | POA: Clinically undetermined

## 2014-03-05 DIAGNOSIS — N72 Inflammatory disease of cervix uteri: Secondary | ICD-10-CM | POA: Insufficient documentation

## 2014-03-05 DIAGNOSIS — N939 Abnormal uterine and vaginal bleeding, unspecified: Principal | ICD-10-CM | POA: Diagnosis present

## 2014-03-05 DIAGNOSIS — D219 Benign neoplasm of connective and other soft tissue, unspecified: Secondary | ICD-10-CM | POA: Diagnosis present

## 2014-03-05 HISTORY — PX: LAPAROSCOPIC ASSISTED VAGINAL HYSTERECTOMY: SHX5398

## 2014-03-05 SURGERY — HYSTERECTOMY, VAGINAL, LAPAROSCOPY-ASSISTED
Anesthesia: General | Site: Abdomen

## 2014-03-05 MED ORDER — FENTANYL CITRATE 0.05 MG/ML IJ SOLN
INTRAMUSCULAR | Status: AC
  Filled 2014-03-05: qty 5

## 2014-03-05 MED ORDER — PROMETHAZINE HCL 25 MG/ML IJ SOLN: 6.2500 mg | INTRAMUSCULAR | Status: DC | PRN

## 2014-03-05 MED ORDER — OXYCODONE-ACETAMINOPHEN 5-325 MG PO TABS
1.0000 | ORAL_TABLET | ORAL | Status: DC | PRN
  Administered 2014-03-06: 1 via ORAL
  Filled 2014-03-05: qty 1

## 2014-03-05 MED ORDER — NALOXONE HCL 0.4 MG/ML IJ SOLN: 0.4000 mg | INTRAMUSCULAR | Status: DC | PRN

## 2014-03-05 MED ORDER — ONDANSETRON HCL 4 MG/2ML IJ SOLN: 4.0000 mg | Freq: Four times a day (QID) | INTRAMUSCULAR | Status: DC | PRN

## 2014-03-05 MED ORDER — FAMOTIDINE 20 MG PO TABS
ORAL_TABLET | ORAL | Status: AC
  Administered 2014-03-05: 20 mg
  Filled 2014-03-05: qty 1

## 2014-03-05 MED ORDER — KETOROLAC TROMETHAMINE 30 MG/ML IJ SOLN: 15.0000 mg | Freq: Once | INTRAMUSCULAR | Status: DC | PRN

## 2014-03-05 MED ORDER — DEXAMETHASONE SODIUM PHOSPHATE 10 MG/ML IJ SOLN
INTRAMUSCULAR | Status: DC | PRN
  Administered 2014-03-05: 10 mg via INTRAVENOUS

## 2014-03-05 MED ORDER — ONDANSETRON HCL 4 MG/2ML IJ SOLN
INTRAMUSCULAR | Status: DC | PRN
  Administered 2014-03-05: 4 mg via INTRAVENOUS

## 2014-03-05 MED ORDER — MENTHOL 3 MG MT LOZG: 1.0000 | LOZENGE | OROMUCOSAL | Status: DC | PRN

## 2014-03-05 MED ORDER — METOPROLOL TARTRATE 1 MG/ML IV SOLN
5.0000 mg | Freq: Once | INTRAVENOUS | Status: DC
  Filled 2014-03-05: qty 5

## 2014-03-05 MED ORDER — SCOPOLAMINE 1 MG/3DAYS TD PT72: 1.0000 | MEDICATED_PATCH | Freq: Once | TRANSDERMAL | Status: DC

## 2014-03-05 MED ORDER — KETOROLAC TROMETHAMINE 30 MG/ML IJ SOLN
INTRAMUSCULAR | Status: AC
  Filled 2014-03-05: qty 1

## 2014-03-05 MED ORDER — FENTANYL CITRATE 0.05 MG/ML IJ SOLN
INTRAMUSCULAR | Status: DC | PRN
  Administered 2014-03-05: 100 ug via INTRAVENOUS
  Administered 2014-03-05 (×2): 50 ug via INTRAVENOUS
  Administered 2014-03-05: 100 ug via INTRAVENOUS
  Administered 2014-03-05 (×3): 50 ug via INTRAVENOUS
  Administered 2014-03-05: 100 ug via INTRAVENOUS
  Administered 2014-03-05 (×3): 50 ug via INTRAVENOUS

## 2014-03-05 MED ORDER — ONDANSETRON HCL 4 MG PO TABS: 4.0000 mg | ORAL_TABLET | Freq: Three times a day (TID) | ORAL | Status: DC | PRN

## 2014-03-05 MED ORDER — SODIUM CHLORIDE 0.9 % IJ SOLN: 9.0000 mL | INTRAMUSCULAR | Status: DC | PRN

## 2014-03-05 MED ORDER — PROPOFOL 10 MG/ML IV BOLUS
INTRAVENOUS | Status: DC | PRN
Start: 2014-03-05 — End: 2014-03-05
  Administered 2014-03-05: 20 mg via INTRAVENOUS
  Administered 2014-03-05: 150 mg via INTRAVENOUS
  Administered 2014-03-05: 30 mg via INTRAVENOUS

## 2014-03-05 MED ORDER — PANTOPRAZOLE SODIUM 40 MG PO TBEC
40.0000 mg | DELAYED_RELEASE_TABLET | Freq: Every day | ORAL | Status: DC
  Administered 2014-03-06: 40 mg via ORAL
  Filled 2014-03-05: qty 1

## 2014-03-05 MED ORDER — 0.9 % SODIUM CHLORIDE (POUR BTL) OPTIME
TOPICAL | Status: DC | PRN
  Administered 2014-03-05: 1000 mL

## 2014-03-05 MED ORDER — DIPHENHYDRAMINE HCL 50 MG/ML IJ SOLN: 12.5000 mg | Freq: Four times a day (QID) | INTRAMUSCULAR | Status: DC | PRN

## 2014-03-05 MED ORDER — BUPIVACAINE HCL (PF) 0.25 % IJ SOLN
INTRAMUSCULAR | Status: AC
  Filled 2014-03-05: qty 30

## 2014-03-05 MED ORDER — HYDROMORPHONE 0.3 MG/ML IV SOLN
INTRAVENOUS | Status: DC
  Administered 2014-03-05: 15:00:00 via INTRAVENOUS
  Administered 2014-03-05: 1.2 mg via INTRAVENOUS
  Administered 2014-03-06: 0.3 mg via INTRAVENOUS
  Filled 2014-03-05: qty 25

## 2014-03-05 MED ORDER — DIPHENHYDRAMINE HCL 12.5 MG/5ML PO ELIX: 12.5000 mg | ORAL_SOLUTION | Freq: Four times a day (QID) | ORAL | Status: DC | PRN

## 2014-03-05 MED ORDER — ACETAMINOPHEN 160 MG/5ML PO SOLN
975.0000 mg | Freq: Once | ORAL | Status: AC
  Administered 2014-03-05: 975 mg via ORAL

## 2014-03-05 MED ORDER — FAMOTIDINE 20 MG PO TABS
20.0000 mg | ORAL_TABLET | Freq: Once | ORAL | Status: AC
  Administered 2014-03-05: 20 mg via ORAL

## 2014-03-05 MED ORDER — KETOROLAC TROMETHAMINE 30 MG/ML IJ SOLN
INTRAMUSCULAR | Status: DC | PRN
  Administered 2014-03-05: 30 mg via INTRAVENOUS

## 2014-03-05 MED ORDER — MIDAZOLAM HCL 2 MG/2ML IJ SOLN
INTRAMUSCULAR | Status: AC
  Filled 2014-03-05: qty 2

## 2014-03-05 MED ORDER — GLYCOPYRROLATE 0.2 MG/ML IJ SOLN
INTRAMUSCULAR | Status: DC | PRN
  Administered 2014-03-05: 0.4 mg via INTRAVENOUS

## 2014-03-05 MED ORDER — ESTRADIOL 0.1 MG/GM VA CREA
TOPICAL_CREAM | VAGINAL | Status: AC
  Filled 2014-03-05: qty 42.5

## 2014-03-05 MED ORDER — VASOPRESSIN 20 UNIT/ML IV SOLN
INTRAVENOUS | Status: DC | PRN
  Administered 2014-03-05: 50 mL via INTRAMUSCULAR

## 2014-03-05 MED ORDER — SCOPOLAMINE 1 MG/3DAYS TD PT72
MEDICATED_PATCH | TRANSDERMAL | Status: AC
  Administered 2014-03-05: 1.5 mg via TRANSDERMAL
  Filled 2014-03-05: qty 1

## 2014-03-05 MED ORDER — GLYCOPYRROLATE 0.2 MG/ML IJ SOLN
INTRAMUSCULAR | Status: AC
  Filled 2014-03-05: qty 4

## 2014-03-05 MED ORDER — HYDROMORPHONE HCL 1 MG/ML IJ SOLN
INTRAMUSCULAR | Status: DC | PRN
  Administered 2014-03-05 (×2): 0.5 mg via INTRAVENOUS
  Administered 2014-03-05: 1 mg via INTRAVENOUS

## 2014-03-05 MED ORDER — ROCURONIUM BROMIDE 100 MG/10ML IV SOLN
INTRAVENOUS | Status: DC | PRN
Start: 2014-03-05 — End: 2014-03-05
  Administered 2014-03-05: 10 mg via INTRAVENOUS
  Administered 2014-03-05: 40 mg via INTRAVENOUS
  Administered 2014-03-05: 10 mg via INTRAVENOUS
  Administered 2014-03-05: 20 mg via INTRAVENOUS
  Administered 2014-03-05 (×2): 10 mg via INTRAVENOUS

## 2014-03-05 MED ORDER — NEOSTIGMINE METHYLSULFATE 10 MG/10ML IV SOLN
INTRAVENOUS | Status: DC | PRN
  Administered 2014-03-05: 2 mg via INTRAVENOUS

## 2014-03-05 MED ORDER — HYDROMORPHONE HCL 1 MG/ML IJ SOLN
INTRAMUSCULAR | Status: AC
  Filled 2014-03-05: qty 1

## 2014-03-05 MED ORDER — ACETAMINOPHEN 160 MG/5ML PO SOLN
ORAL | Status: AC
  Administered 2014-03-05: 975 mg via ORAL
  Filled 2014-03-05: qty 40.6

## 2014-03-05 MED ORDER — LACTATED RINGERS IV SOLN
INTRAVENOUS | Status: DC
  Administered 2014-03-05 (×5): via INTRAVENOUS

## 2014-03-05 MED ORDER — NEOSTIGMINE METHYLSULFATE 10 MG/10ML IV SOLN
INTRAVENOUS | Status: AC
  Filled 2014-03-05: qty 1

## 2014-03-05 MED ORDER — IBUPROFEN 600 MG PO TABS: 600.0000 mg | ORAL_TABLET | Freq: Four times a day (QID) | ORAL | Status: DC | PRN

## 2014-03-05 MED ORDER — DEXAMETHASONE SODIUM PHOSPHATE 10 MG/ML IJ SOLN
INTRAMUSCULAR | Status: AC
  Filled 2014-03-05: qty 1

## 2014-03-05 MED ORDER — FAMOTIDINE 20 MG PO TABS
ORAL_TABLET | ORAL | Status: AC
  Administered 2014-03-05: 20 mg via ORAL
  Filled 2014-03-05: qty 1

## 2014-03-05 MED ORDER — BUPIVACAINE HCL (PF) 0.25 % IJ SOLN
INTRAMUSCULAR | Status: DC | PRN
  Administered 2014-03-05: 10 mL

## 2014-03-05 MED ORDER — LABETALOL HCL 5 MG/ML IV SOLN
INTRAVENOUS | Status: AC
  Filled 2014-03-05: qty 4

## 2014-03-05 MED ORDER — ONDANSETRON HCL 4 MG/2ML IJ SOLN
INTRAMUSCULAR | Status: AC
  Filled 2014-03-05: qty 2

## 2014-03-05 MED ORDER — HEPARIN SODIUM (PORCINE) 5000 UNIT/ML IJ SOLN
INTRAMUSCULAR | Status: AC
  Filled 2014-03-05: qty 1

## 2014-03-05 MED ORDER — LACTATED RINGERS IV SOLN: INTRAVENOUS | Status: DC

## 2014-03-05 MED ORDER — LABETALOL HCL 5 MG/ML IV SOLN
INTRAVENOUS | Status: DC | PRN
  Administered 2014-03-05 (×4): 5 mg via INTRAVENOUS

## 2014-03-05 MED ORDER — HYDROMORPHONE HCL 1 MG/ML IJ SOLN: 0.2500 mg | INTRAMUSCULAR | Status: DC | PRN

## 2014-03-05 MED ORDER — LACTATED RINGERS IV SOLN
INTRAVENOUS | Status: DC
  Administered 2014-03-06: 05:00:00 via INTRAVENOUS

## 2014-03-05 MED ORDER — PROPOFOL 10 MG/ML IV EMUL
INTRAVENOUS | Status: AC
  Filled 2014-03-05: qty 20

## 2014-03-05 MED ORDER — MEPERIDINE HCL 25 MG/ML IJ SOLN
6.2500 mg | INTRAMUSCULAR | Status: DC | PRN
Start: 2014-03-05 — End: 2014-03-05

## 2014-03-05 MED ORDER — SCOPOLAMINE 1 MG/3DAYS TD PT72
1.0000 | MEDICATED_PATCH | Freq: Once | TRANSDERMAL | Status: DC
  Administered 2014-03-05: 1.5 mg via TRANSDERMAL

## 2014-03-05 MED ORDER — LIDOCAINE HCL (CARDIAC) 20 MG/ML IV SOLN
INTRAVENOUS | Status: DC | PRN
Start: 2014-03-05 — End: 2014-03-05
  Administered 2014-03-05: 20 mg via INTRAVENOUS

## 2014-03-05 MED ORDER — LIDOCAINE HCL (CARDIAC) 20 MG/ML IV SOLN
INTRAVENOUS | Status: AC
  Filled 2014-03-05: qty 5

## 2014-03-05 MED ORDER — MIDAZOLAM HCL 5 MG/5ML IJ SOLN
INTRAMUSCULAR | Status: DC | PRN
  Administered 2014-03-05 (×2): 1 mg via INTRAVENOUS
  Administered 2014-03-05: 2 mg via INTRAVENOUS

## 2014-03-05 MED ORDER — ESTRADIOL 0.1 MG/GM VA CREA
TOPICAL_CREAM | VAGINAL | Status: DC | PRN
  Administered 2014-03-05: 1 via VAGINAL

## 2014-03-05 MED ORDER — METOPROLOL TARTRATE 1 MG/ML IV SOLN
INTRAVENOUS | Status: DC | PRN
  Administered 2014-03-05: 2 mg via INTRAVENOUS
  Administered 2014-03-05 (×3): 1 mg via INTRAVENOUS

## 2014-03-05 MED ORDER — KETOROLAC TROMETHAMINE 30 MG/ML IJ SOLN
30.0000 mg | Freq: Four times a day (QID) | INTRAMUSCULAR | Status: DC
  Administered 2014-03-06: 30 mg via INTRAVENOUS
  Filled 2014-03-05: qty 1

## 2014-03-05 MED ORDER — SODIUM CHLORIDE 0.9 % IJ SOLN
INTRAMUSCULAR | Status: AC
  Filled 2014-03-05: qty 50

## 2014-03-05 SURGICAL SUPPLY — 89 items
BLADE SURG 10 STRL SS (BLADE) ×4 IMPLANT
BLADE SURG 11 STRL SS (BLADE) ×8 IMPLANT
CABLE HIGH FREQUENCY MONO STRZ (ELECTRODE) IMPLANT
CANISTER SUCT 3000ML (MISCELLANEOUS) ×4 IMPLANT
CATH ROBINSON RED A/P 16FR (CATHETERS) IMPLANT
CLOTH BEACON ORANGE TIMEOUT ST (SAFETY) ×4 IMPLANT
CONT PATH 16OZ SNAP LID 3702 (MISCELLANEOUS) ×4 IMPLANT
CONT SPECI 4OZ STER CLIK (MISCELLANEOUS) IMPLANT
COVER BACK TABLE 60X90IN (DRAPES) ×4 IMPLANT
COVER LIGHT HANDLE  1/PK (MISCELLANEOUS) ×2
COVER LIGHT HANDLE 1/PK (MISCELLANEOUS) ×4 IMPLANT
DECANTER SPIKE VIAL GLASS SM (MISCELLANEOUS) ×3 IMPLANT
DRAIN JACKSON PRT FLT 7MM (DRAIN) IMPLANT
DRAPE SHEET LG 3/4 BI-LAMINATE (DRAPES) ×8 IMPLANT
DRAPE WARM FLUID 44X44 (DRAPE) IMPLANT
DRSG COVADERM PLUS 2X2 (GAUZE/BANDAGES/DRESSINGS) ×8 IMPLANT
DRSG OPSITE POSTOP 3X4 (GAUZE/BANDAGES/DRESSINGS) ×3 IMPLANT
DRSG OPSITE POSTOP 4X10 (GAUZE/BANDAGES/DRESSINGS) ×4 IMPLANT
DURAPREP 26ML APPLICATOR (WOUND CARE) ×4 IMPLANT
ELECT CAUTERY BLADE 6.4 (BLADE) IMPLANT
ELECT NDL TIP 2.8 STRL (NEEDLE) IMPLANT
ELECT NEEDLE TIP 2.8 STRL (NEEDLE) IMPLANT
ELECT REM PT RETURN 9FT ADLT (ELECTROSURGICAL) ×4
ELECTRODE REM PT RTRN 9FT ADLT (ELECTROSURGICAL) ×3 IMPLANT
EVACUATOR SILICONE 100CC (DRAIN) IMPLANT
EVACUATOR SMOKE 8.L (FILTER) ×4 IMPLANT
FORCEPS CUTTING 33CM 5MM (CUTTING FORCEPS) IMPLANT
FORCEPS CUTTING 45CM 5MM (CUTTING FORCEPS) IMPLANT
GAUZE PACKING 2X5 YD STRL (GAUZE/BANDAGES/DRESSINGS) ×3 IMPLANT
GAUZE SPONGE 4X4 16PLY XRAY LF (GAUZE/BANDAGES/DRESSINGS) ×4 IMPLANT
GLOVE BIOGEL PI IND STRL 6.5 (GLOVE) ×3 IMPLANT
GLOVE BIOGEL PI INDICATOR 6.5 (GLOVE) ×1
GLOVE SURG SS PI 6.5 STRL IVOR (GLOVE) ×15 IMPLANT
GOWN STRL REUS W/ TWL LRG LVL3 (GOWN DISPOSABLE) ×25 IMPLANT
GOWN STRL REUS W/TWL LRG LVL3 (GOWN DISPOSABLE) ×48 IMPLANT
LEGGING LITHOTOMY PAIR STRL (DRAPES) ×3 IMPLANT
LIQUID BAND (GAUZE/BANDAGES/DRESSINGS) ×3 IMPLANT
NDL HYPO 25X1 1.5 SAFETY (NEEDLE) IMPLANT
NDL SPNL 22GX3.5 QUINCKE BK (NEEDLE) ×1 IMPLANT
NEEDLE HYPO 25X1 1.5 SAFETY (NEEDLE) IMPLANT
NEEDLE MAYO .5 CIRCLE (NEEDLE) ×4 IMPLANT
NEEDLE SPNL 22GX3.5 QUINCKE BK (NEEDLE) ×4 IMPLANT
NS IRRIG 1000ML POUR BTL (IV SOLUTION) ×4 IMPLANT
PACK ABDOMINAL GYN (CUSTOM PROCEDURE TRAY) ×4 IMPLANT
PACK LAVH (CUSTOM PROCEDURE TRAY) ×4 IMPLANT
PAD MAGNETIC INST (MISCELLANEOUS) ×4 IMPLANT
PAD OB MATERNITY 4.3X12.25 (PERSONAL CARE ITEMS) ×4 IMPLANT
PAD TRENDELENBURG OR TABLE (MISCELLANEOUS) ×4 IMPLANT
PROTECTOR NERVE ULNAR (MISCELLANEOUS) ×4 IMPLANT
SEALER TISSUE G2 CVD JAW 35 (ENDOMECHANICALS) ×2 IMPLANT
SEALER TISSUE G2 CVD JAW 45CM (ENDOMECHANICALS) ×1
SET IRRIG TUBING LAPAROSCOPIC (IRRIGATION / IRRIGATOR) ×3 IMPLANT
SHEARS HARMONIC ACE PLUS 36CM (ENDOMECHANICALS) IMPLANT
SOLUTION ELECTROLUBE (MISCELLANEOUS) IMPLANT
SPONGE LAP 18X18 X RAY DECT (DISPOSABLE) ×2 IMPLANT
STAPLER VISISTAT 35W (STAPLE) IMPLANT
STRIP CLOSURE SKIN 1/4X3 (GAUZE/BANDAGES/DRESSINGS) IMPLANT
SUT CHROMIC 2 0 TIES 18 (SUTURE) IMPLANT
SUT MNCRL AB 3-0 PS2 27 (SUTURE) ×3 IMPLANT
SUT PDS AB 1 CT  36 (SUTURE)
SUT PDS AB 1 CT 36 (SUTURE) IMPLANT
SUT PLAIN 2 0 XLH (SUTURE) IMPLANT
SUT SILK 0 FSL (SUTURE) IMPLANT
SUT VIC AB 0 CT1 18XCR BRD8 (SUTURE) ×9 IMPLANT
SUT VIC AB 0 CT1 27 (SUTURE) ×4
SUT VIC AB 0 CT1 27XBRD ANBCTR (SUTURE) IMPLANT
SUT VIC AB 0 CT1 27XCR 8 STRN (SUTURE) ×3 IMPLANT
SUT VIC AB 0 CT1 8-18 (SUTURE) ×12
SUT VIC AB 2-0 CT1 (SUTURE) ×4 IMPLANT
SUT VIC AB 2-0 SH 27 (SUTURE) ×8
SUT VIC AB 2-0 SH 27XBRD (SUTURE) ×6 IMPLANT
SUT VIC AB 3-0 PS2 18 (SUTURE) ×8
SUT VIC AB 3-0 PS2 18XBRD (SUTURE) ×5 IMPLANT
SUT VIC AB 3-0 SH 27 (SUTURE)
SUT VIC AB 3-0 SH 27X BRD (SUTURE) IMPLANT
SUT VICRYL 0 ENDOLOOP (SUTURE) IMPLANT
SUT VICRYL 0 TIES 12 18 (SUTURE) ×4 IMPLANT
SUT VICRYL 0 UR6 27IN ABS (SUTURE) ×6 IMPLANT
SYR 20CC LL (SYRINGE) ×4 IMPLANT
SYR 50ML LL SCALE MARK (SYRINGE) ×4 IMPLANT
SYR CONTROL 10ML LL (SYRINGE) IMPLANT
SYR TB 1ML 25GX5/8 (SYRINGE) IMPLANT
SYR TB 1ML LUER SLIP (SYRINGE) ×4 IMPLANT
TOWEL OR 17X24 6PK STRL BLUE (TOWEL DISPOSABLE) ×11 IMPLANT
TRAY FOLEY CATH 14FR (SET/KITS/TRAYS/PACK) ×4 IMPLANT
TROCAR BALL TOP DISP 5MM (ENDOMECHANICALS) ×4 IMPLANT
TROCAR XCEL DIL TIP R 11M (ENDOMECHANICALS) ×4 IMPLANT
WARMER LAPAROSCOPE (MISCELLANEOUS) ×4 IMPLANT
WATER STERILE IRR 1000ML POUR (IV SOLUTION) ×4 IMPLANT

## 2014-03-05 NOTE — H&P (Signed)
History and Physical Interval Note:   03/05/2014   7:23 AM   Natalie Schmitt  has presented today for surgery, with the diagnosis of Irregular Menstrual Cycle, Uterine Fibroids  The various methods of treatment have been discussed with the patient and family. After consideration of risks, benefits and other options for treatment, the patient has consented to  Procedure(s): LAPAROSCOPIC ASSISTED VAGINAL HYSTERECTOMY bilateral Salpingectomy possible TAH BILATERAL SALPINGECTOMY HYSTERECTOMY ABDOMINAL as a surgical intervention .  I have examined the patient, reviewed the patients' chart and labs.  Questions were answered to the patient's satisfaction.  She wishes to proceed   Eldred Manges  MD

## 2014-03-05 NOTE — Anesthesia Preprocedure Evaluation (Addendum)
Anesthesia Evaluation  Patient identified by MRN, date of birth, ID band Patient awake    Reviewed: Allergy & Precautions, H&P , NPO status , Patient's Chart, lab work & pertinent test results, reviewed documented beta blocker date and time   History of Anesthesia Complications Negative for: history of anesthetic complications  Airway Mallampati: II  TM Distance: >3 FB Neck ROM: full    Dental  (+) Teeth Intact   Pulmonary former smoker,  breath sounds clear to auscultation  Pulmonary exam normal       Cardiovascular hypertension (has been off meds for 2 years), Rhythm:regular Rate:Normal     Neuro/Psych negative neurological ROS  negative psych ROS   GI/Hepatic Neg liver ROS, GERD-  Medicated,  Endo/Other  BMI 37.2  Renal/GU negative Renal ROS  Female GU complaint     Musculoskeletal  (+) Arthritis - (in back),   Abdominal   Peds  Hematology negative hematology ROS (+)   Anesthesia Other Findings Caution with positioning - arthritis in back  Reproductive/Obstetrics negative OB ROS                         Anesthesia Physical Anesthesia Plan  ASA: II  Anesthesia Plan: General ETT   Post-op Pain Management:    Induction:   Airway Management Planned:   Additional Equipment:   Intra-op Plan:   Post-operative Plan:   Informed Consent: I have reviewed the patients History and Physical, chart, labs and discussed the procedure including the risks, benefits and alternatives for the proposed anesthesia with the patient or authorized representative who has indicated his/her understanding and acceptance.   Dental Advisory Given  Plan Discussed with: CRNA and Surgeon  Anesthesia Plan Comments:         Anesthesia Quick Evaluation

## 2014-03-05 NOTE — Plan of Care (Signed)
Problem: Phase I Progression Outcomes Goal: Pain controlled with appropriate interventions Outcome: Completed/Met Date Met:  03/05/14 Goal: VS, stable, temp < 100.4 degrees F Outcome: Completed/Met Date Met:  03/05/14 Goal: I & O every 4 hrs or as ordered Outcome: Completed/Met Date Met:  03/05/14 Goal: IS, TCDB as ordered Outcome: Completed/Met Date Met:  03/05/14

## 2014-03-05 NOTE — Plan of Care (Signed)
Problem: Phase I Progression Outcomes Goal: Dangle/OOB as tolerated per MD order Outcome: Progressing

## 2014-03-05 NOTE — Op Note (Signed)
OPERATIVE REPORT   INDICATIONS:abnormal uterine bleeding and fibroids  PRE-OP DIAGNOSIS:Irregular Menstrual Cycle, Uterine Fibroids   POST-OP DIAGNOSIS;Irregular Menstrual Cycle, Uterine Fibroids   PROCEDURE:Procedure(s) (LRB): LAPAROSCOPIC ASSISTED VAGINAL HYSTERECTOMY bilateral Salpingectomy (N/A)   SURGEON:Aydan Phoenix P  ASSIST: Earnstine Regal certified physician assistant  SPECIMENS:@ORSPECIMEN @ uterus, cervix and bilateral fallopian tubes  DISPOSITION OF SPECIMEN:Pathology   FINDINGS: The uterus was upper limits of normal size. The tubes were status post interruption for surgical sterilization. The ovaries were normal bilaterally. The anterior peritoneum was densely adherent to anterior uterus at the site of the previous cesarean section incisions.  EBL: Per anesthesia 166 cc  COMPLICATIONS: None   PATIENT TO:  PACU - hemodynamically stable.  PROCEDURE DETAILS: The patient was taken to the operating room after appropriate identification and placed on the operating table.  After the attainment of adequate general anesthesia the patient was placed in the modified lithotomy position. The perineum and vagina were prepped with multiple layers of Betadine. A Foley catheter was inserted into the bladder and connected to straight drainage.  the abdomen was prepped with ChloraPrep. The abdomen and perineum were draped as a sterile field.  A gray speculum was placed in the vagina. A Hulka tenaculum was placed on the anterior cervix. Subumbilical injection of quarter percent Marcaine as well as suprapubic injection to the right and left of midline were undertaken for a total of 10 cc. A subumbilical incision was made and incision taken down into the peritoneal cavity. The peritoneum and fascia were marked with sutures of 0 Vicryl. The Hassan cannula was placed through the umbilical incision into the peritoneal cavity and the sutures wrapped around the Topton cannula in place. The  laparoscope was placed through the trocar sleeve. Suprapubic incisions to the right and left of midline were made, and laparoscopic probe trocar was placed through the incisions under direct visualization. There was a 10 mm trocar on the left 5 mm on the right.  The above-noted findings were made. The right fallopian tube was elevated and cauterized and cut along its mesal salpinx attachment to the right ovary.  The distal right fallopian tube was then removed from the operative field through the 10 mm suprapubic port. The right round ligament was identified, elevated and cauterized . Then cut all with the Enseal apparatus. The utero-ovarian ligament was likewise cauterized and cut. The parametrial tissue on the right side was cauterized and cut down to the level of the uterine artery. The anterior leaf of the broad ligament had been elevated and incised down to the level of the cervix. A similar procedure was carried out on the left side with the left fallopian tube, left round ligament, left utero-ovarian ligament, and the parametrial tissues. The anterior leaf of the broad ligament on the left side was more densely adherent. At this time, the decision was made to move to the vaginal portion of the procedure.   A weighted speculum was placed in the posterior vagina and Lahey tenaculae were placed on the anterior and posterior surfaces of the cervix. The cervicovaginal mucosa was injected with a dilute solution of Pitressin. The cervix was circumscribed. The anterior vaginal mucosa was sharply dissected off the anterior cervix .Marland Kitchen The bladder blade was placed.. The posterior peritoneum was entered sharply and tagged. Uterosacral ligaments on the right and left side were clamped cut and suture ligated, and the sutures held. The paracervical tissues were then clamped cut and suture ligated. The uterine arteries were clamped cut and suture ligated.  The bladder was further sharply dissected off the anterior cervix  and uterus until it entered sharply and safely into the peritoneal cavity and the bladder blade moved to elevate the bladder. The uterus was morcellated to allow the last parametrial pedicles to be clamped.  The parametral tissues were clamped, cut, and suture ligated.The uterus was inverted and the remaining parametrial pedicles clamped, cut, tied with free tie and suture-ligated. The uterus and cervix were removed from the operative field.  The McCall culdoplasty sutures were then placed incorporating the uterosacral ligaments on either side, and the intervening peritoneum.  These were held. The vaginal angles were created with the sutures from the  uterosacral ligaments that had been held and placed through the anterior and posterior surfaces of the vagina then tied down. The remainder of the vaginal cuff was closed with figure-of-eight sutures of.  All sutures used were .0 Vicryl  The McCall culdoplasty sutures were then tied down. A 2 inch plain vaginal packing moistened with Estrace cream was placed in the vagina.  The surgeon changed gown and glove and returned to the abdomen. A pneumoperitoneum was re-created. The laparoscope was used to inspect the peritoneal cavity and it was found to be hemostatic. The peritoneum was copiously irrigated and approximately 100 cc of warm lactated Ringer's left in the peritoneal cavity. All instruments were then removed from the peritoneal cavity under direct visualization as the CO2 was allowed to escape. The subumbilical incision was closed with figure-of-eight sutures of 0 Vicryl using the sutures that had marked. The peritoneum and fascia. The skin incision in the subumbilical area was closed with a subcuticular suture of 3-0 Monocryl. The suprapubic incisions were closed with Dermabond and Dermabond placed over the suture line for the subumbilical incision.  The patient was awakened from general anesthesia and taken to the recovery room in satisfactory condition  having tolerated the procedure well the sponge and instrument counts correct.  Eldred Manges, MD 1:26 PM

## 2014-03-05 NOTE — Plan of Care (Signed)
Problem: Phase I Progression Outcomes Goal: VS, stable, temp < 100.4 degrees F Outcome: Progressing

## 2014-03-05 NOTE — Plan of Care (Signed)
Problem: Phase I Progression Outcomes Goal: Pain controlled with appropriate interventions Outcome: Progressing     

## 2014-03-05 NOTE — Plan of Care (Signed)
Problem: Phase I Progression Outcomes Goal: I & O every 4 hrs or as ordered Outcome: Progressing

## 2014-03-05 NOTE — Plan of Care (Signed)
Problem: Phase I Progression Outcomes Goal: IS, TCDB as ordered Outcome: Progressing

## 2014-03-05 NOTE — Transfer of Care (Signed)
Immediate Anesthesia Transfer of Care Note  Patient: Natalie Schmitt  Procedure(s) Performed: Procedure(s) with comments: LAPAROSCOPIC ASSISTED VAGINAL HYSTERECTOMY bilateral Salpingectomy possible TAH (N/A) - vaginal BILATERAL SALPINGECTOMY (Bilateral) HYSTERECTOMY ABDOMINAL (N/A)  Patient Location: PACU  Anesthesia Type:General  Level of Consciousness: awake  Airway & Oxygen Therapy: Patient Spontanous Breathing and Patient connected to nasal cannula oxygen  Post-op Assessment: Report given to PACU RN and Post -op Vital signs reviewed and stable  Post vital signs: stable  Complications: No apparent anesthesia complications

## 2014-03-05 NOTE — Anesthesia Postprocedure Evaluation (Signed)
  Anesthesia Post-op Note  Patient: Natalie Schmitt  Procedure(s) Performed: Procedure(s) with comments: LAPAROSCOPIC ASSISTED VAGINAL HYSTERECTOMY bilateral Salpingectomy (N/A) - vaginal  Patient Location: [facility]  Anesthesia Type:General  Level of Consciousness: awake, alert  and oriented  Airway and Oxygen Therapy: Patient Spontanous Breathing and Patient connected to nasal cannula oxygen  Post-op Pain: none  Post-op Assessment: Post-op Vital signs reviewed and Patient's Cardiovascular Status Stable  Post-op Vital Signs: Reviewed and stable  Last Vitals:  Filed Vitals:   03/05/14 1535  BP: 148/64  Pulse:   Temp: 36.8 C  Resp: 16    Complications: No apparent anesthesia complications

## 2014-03-05 NOTE — Plan of Care (Signed)
Problem: Phase I Progression Outcomes Goal: Admission history reviewed Outcome: Completed/Met Date Met:  03/05/14     

## 2014-03-06 ENCOUNTER — Encounter (HOSPITAL_COMMUNITY): Payer: Self-pay | Admitting: Obstetrics and Gynecology

## 2014-03-06 DIAGNOSIS — IMO0001 Reserved for inherently not codable concepts without codable children: Secondary | ICD-10-CM | POA: Clinically undetermined

## 2014-03-06 DIAGNOSIS — N939 Abnormal uterine and vaginal bleeding, unspecified: Secondary | ICD-10-CM | POA: Diagnosis not present

## 2014-03-06 DIAGNOSIS — R030 Elevated blood-pressure reading, without diagnosis of hypertension: Secondary | ICD-10-CM

## 2014-03-06 LAB — CBC
HCT: 32.7 % — ABNORMAL LOW (ref 36.0–46.0)
Hemoglobin: 11.4 g/dL — ABNORMAL LOW (ref 12.0–15.0)
MCH: 30 pg (ref 26.0–34.0)
MCHC: 34.9 g/dL (ref 30.0–36.0)
MCV: 86.1 fL (ref 78.0–100.0)
Platelets: 296 10*3/uL (ref 150–400)
RBC: 3.8 MIL/uL — ABNORMAL LOW (ref 3.87–5.11)
RDW: 15.1 % (ref 11.5–15.5)
WBC: 11.5 10*3/uL — ABNORMAL HIGH (ref 4.0–10.5)

## 2014-03-06 MED ORDER — OXYCODONE-ACETAMINOPHEN 5-325 MG PO TABS: 1.0000 | ORAL_TABLET | ORAL | Status: DC | PRN

## 2014-03-06 MED ORDER — ONDANSETRON HCL 4 MG PO TABS: 4.0000 mg | ORAL_TABLET | Freq: Three times a day (TID) | ORAL | Status: DC | PRN

## 2014-03-06 MED ORDER — IBUPROFEN 600 MG PO TABS: ORAL_TABLET | ORAL | Status: AC

## 2014-03-06 NOTE — Plan of Care (Signed)
Problem: Phase II Progression Outcomes Goal: Afebrile, VS remain stable Outcome: Progressing

## 2014-03-06 NOTE — Progress Notes (Signed)
Natalie Schmitt is a48 y.o.  [id number]  Post Op Date # 1:  LAVH/BS  Subjective: Patient is Doing well postoperatively. Patient has good pain control with PCA narcotic and Toradol.  Patient is ambulating in the halls, voiding and tolerating liquids. Last night had transient nausea with no vomiting but none since.  Hasn't passed flatus.  Objective: Vital signs in last 24 hours: Temp:  [98.1 F (36.7 C)-99.3 F (37.4 C)] 99.3 F (37.4 C) (12/03 0552) Pulse Rate:  [99-124] 99 (12/03 0552) Resp:  [10-16] 12 (12/03 0552) BP: (143-170)/(64-83) 165/83 mmHg (12/03 0552) SpO2:  [95 %-100 %] 99 % (12/03 0552) Weight:  [216 lb (97.977 kg)] 216 lb (97.977 kg) (12/02 1436)  Intake/Output from previous day: 12/02 0701 - 12/03 0700 In: 7998 [P.O.:600; I.V.:6298] Out: 3175 [Urine:2725] Intake/Output this shift: Total I/O In: -  Out: 600 [Urine:600]  Recent Labs Lab 02/28/14 1115 03/06/14 0525  WBC 6.3 11.5*  HGB 13.8 11.4*  HCT 41.2 32.7*  PLT 325 296    No results for input(s): NA, K, CL, CO2, BUN, CREATININE, CALCIUM, PROT, BILITOT, ALKPHOS, ALT, AST, GLUCOSE in the last 168 hours.  Invalid input(s): LABALBU  EXAM: General: alert, cooperative and no distress Resp: clear to auscultation bilaterally Cardio: regular rate and rhythm, S1, S2 normal, no murmur, click, rub or gallop GI: Decreased bowel sounds, soft abdomen with clean/dry and intact umbilical incision; lower abdominal incisions intact without evidence of infecton. Extremities: SCD hose on and functioning;  no calf tenderness. Vaginal Bleeding: none   Assessment: s/p Procedure(s): LAPAROSCOPIC ASSISTED VAGINAL HYSTERECTOMY bilateral Salpingectomy: stable and progressing well  Plan: Advance diet Oral Pain meds Probalbe discharge home today  LOS: 1 day    POWELL,ELMIRA, PA-C 03/06/2014 8:15 AM

## 2014-03-06 NOTE — Plan of Care (Signed)
Problem: Phase II Progression Outcomes Goal: Progress activity as tolerated unless otherwise ordered Outcome: Progressing     

## 2014-03-06 NOTE — Plan of Care (Signed)
Problem: Discharge Progression Outcomes Goal: Complications resolved/controlled Outcome: Completed/Met Date Met:  03/06/14

## 2014-03-06 NOTE — Plan of Care (Signed)
Problem: Discharge Progression Outcomes Goal: Discharge plan in place and appropriate Outcome: Completed/Met Date Met:  03/06/14     

## 2014-03-06 NOTE — Plan of Care (Signed)
Problem: Discharge Progression Outcomes Goal: Pain controlled with appropriate interventions Outcome: Completed/Met Date Met:  03/06/14

## 2014-03-06 NOTE — Plan of Care (Signed)
Problem: Discharge Progression Outcomes Goal: Hemodynamically stable Outcome: Completed/Met Date Met:  03/06/14

## 2014-03-06 NOTE — Plan of Care (Signed)
Problem: Discharge Progression Outcomes Goal: Activity appropriate for discharge plan Outcome: Completed/Met Date Met:  03/06/14     

## 2014-03-06 NOTE — Plan of Care (Signed)
Problem: Phase II Progression Outcomes Goal: Foley discontinued Outcome: Completed/Met Date Met:  03/06/14

## 2014-03-06 NOTE — Plan of Care (Signed)
Problem: Discharge Progression Outcomes Goal: Discontinue staples (if applicable) Outcome: Completed/Met Date Met:  03/06/14

## 2014-03-06 NOTE — Plan of Care (Signed)
Problem: Discharge Progression Outcomes Goal: Tolerating diet Outcome: Completed/Met Date Met:  03/06/14

## 2014-03-06 NOTE — Plan of Care (Signed)
Problem: Phase II Progression Outcomes Goal: Incision/dressings dry and intact Outcome: Completed/Met Date Met:  03/06/14

## 2014-03-06 NOTE — Plan of Care (Signed)
Problem: Phase II Progression Outcomes Goal: Voiding trials/Bladder training within 48 hrs Outcome: Completed/Met Date Met:  03/06/14

## 2014-03-06 NOTE — Plan of Care (Signed)
Problem: Phase II Progression Outcomes Goal: Remove staples if indicated/incision care Outcome: Not Applicable Date Met:  15/82/65

## 2014-03-06 NOTE — Progress Notes (Signed)
Pt.is discharged in the care of husband. Downstairs per ambulatory with N.T. Escort. Denies any pain or discomfort. Abdominal incisions are clean and dry.Discharge instructions with Rx were given to pt.Questions were asked and answered. States she understands all instructions. No equipment neede for home use.

## 2014-03-06 NOTE — Progress Notes (Signed)
LATE ENTRY FROM VISIT ON 03/05/14 AT 1945 DAY 0 Procedure(s) (LRB): LAPAROSCOPIC ASSISTED VAGINAL HYSTERECTOMY bilateral Salpingectomy (N/A)  Subjective: Patient reports MINIMALnausea, vomiting, incisional pain and tolerating PO.    Objective: I have reviewed patient's vital signs, intake and output and medications.  General: alert and cooperative Resp: clear to auscultation bilaterally Cardio: regular rate and rhythm, S1, S2 normal, no murmur, click, rub or gallop GI: soft, non-tender; bowel sounds normal; no masses,  no organomegaly Extremities: extremities normal, atraumatic, no cyanosis or edema Vaginal Bleeding: minimal  Assessment: s/p Procedure(s) with comments: LAPAROSCOPIC ASSISTED VAGINAL HYSTERECTOMY bilateral Salpingectomy (N/A) - vaginal: stable  Plan: Advance diet Encourage ambulation Discharge home in am   Pace Lamadrid P 03/06/2014, 10:08 AM

## 2014-03-06 NOTE — Discharge Summary (Signed)
Physician Discharge Summary  Patient ID: Natalie Schmitt MRN: [id number] DOB/AGE: 1965-08-28 48 y.o.  Admit date: 03/05/2014 Discharge date: 03/06/2014   Discharge Diagnoses: Irregular Bleeding and Uterine Fibroids  Active Problems:   Fibroids   Abnormal uterine bleeding   Irregular bleeding   Elevated blood pressure   Operation: Laparoscopically Assisted Vaginal Hysterectomy with Bilateral Salpingectomy   Discharged Condition: Good  Hospital Course: On the date of admission the patient underwent the aforementioned procedures and tolerated them well.  Post operative course was unremarkable except for elevated blood pressure readings,  with the patient resuming bowel and bladder function and tolerating a post operative hemoglobin of 11.4 by post operative day #1 and was therefore deemed ready for discharge home.  Disposition:   Discharge Medications:    Medication List    STOP taking these medications        aspirin 81 MG tablet     meloxicam 15 MG tablet  Commonly known as:  MOBIC     methylPREDNIsolone 4 MG tablet  Commonly known as:  MEDROL DOSPACK      TAKE these medications        ibuprofen 600 MG tablet  Commonly known as:  ADVIL,MOTRIN  1  po  pc every 6 hours for 5 days then prn-pain     L-Lysine 500 MG Tabs  Take 500 mg by mouth.     MINI FISH OIL PO  Take 1,290 mg by mouth.     multivitamin tablet  Take 1 tablet by mouth daily.     omeprazole 20 MG capsule  Commonly known as:  PRILOSEC  Take 20 mg by mouth daily.     ondansetron 4 MG tablet  Commonly known as:  ZOFRAN  Take 1 tablet (4 mg total) by mouth every 8 (eight) hours as needed for nausea or vomiting.     oxyCODONE-acetaminophen 5-325 MG per tablet  Commonly known as:  PERCOCET/ROXICET  Take 1-2 tablets by mouth every 4 (four) hours as needed for severe pain (moderate to severe pain (when tolerating fluids)).     TURMERIC PO  Take 1 capsule by mouth as needed.     vitamin E 1000 UNIT  capsule  Take 1,000 Units by mouth daily.          Follow-up: Dr. Lorriane Shire P. Haygood on April 09, 2014 at 10:15 a.m. Follow-up : PCP Dr. Moreen Fowler.  Call for appointment for blood pressure evaluation   Signed: HAYGOOD,VANESSA P,  03/06/2014, 9:32 AM

## 2014-03-06 NOTE — Plan of Care (Signed)
Problem: Discharge Progression Outcomes Goal: Barriers To Progression Addressed/Resolved Outcome: Completed/Met Date Met:  03/06/14

## 2014-03-06 NOTE — Discharge Instructions (Signed)
Call [facility] @ [phone] if:  You have a temperature greater than or equal to 100.4 degrees Farenheit orally You have pain that is not made better by the pain medication given and taken as directed You have excessive bleeding or problems urinating  Take Colace (Docusate Sodium/Stool Softener) 100 mg 2-3 times daily while taking narcotic pain medicine to avoid constipation or until bowel movements are regular.  You may drive after 2  weeks You may walk up steps You may shower   You may resume a regular diet Keep incisions clean and dry  Do not lift over 15 pounds for 6 weeks Avoid anything in vagina for 6 weeks (or until after your post-operative visit)  Follow up with your primary care physician for management of your elevated blood pressure

## 2014-04-08 ENCOUNTER — Other Ambulatory Visit (HOSPITAL_COMMUNITY): Payer: Self-pay | Admitting: Obstetrics and Gynecology

## 2014-04-08 DIAGNOSIS — Z1231 Encounter for screening mammogram for malignant neoplasm of breast: Secondary | ICD-10-CM

## 2014-05-13 ENCOUNTER — Ambulatory Visit (HOSPITAL_COMMUNITY)
Admission: RE | Admit: 2014-05-13 | Discharge: 2014-05-13 | Disposition: A | Discharge status: Home / Self Care | Payer: BLUE CROSS/BLUE SHIELD | Source: Ambulatory Visit | Attending: Obstetrics and Gynecology | Admitting: Obstetrics and Gynecology

## 2014-05-13 DIAGNOSIS — Z1231 Encounter for screening mammogram for malignant neoplasm of breast: Secondary | ICD-10-CM | POA: Diagnosis not present

## 2015-07-10 ENCOUNTER — Other Ambulatory Visit: Payer: Self-pay

## 2015-07-10 DIAGNOSIS — Z1231 Encounter for screening mammogram for malignant neoplasm of breast: Secondary | ICD-10-CM

## 2015-07-28 ENCOUNTER — Ambulatory Visit
Admission: RE | Admit: 2015-07-28 | Discharge: 2015-07-28 | Disposition: A | Discharge status: Home / Self Care | Payer: BLUE CROSS/BLUE SHIELD | Source: Ambulatory Visit

## 2015-07-28 DIAGNOSIS — Z1231 Encounter for screening mammogram for malignant neoplasm of breast: Secondary | ICD-10-CM

## 2015-10-28 ENCOUNTER — Encounter: Payer: BLUE CROSS/BLUE SHIELD | Admitting: Obstetrics and Gynecology

## 2015-11-12 ENCOUNTER — Ambulatory Visit (INDEPENDENT_AMBULATORY_CARE_PROVIDER_SITE_OTHER): Payer: BLUE CROSS/BLUE SHIELD | Admitting: Obstetrics and Gynecology

## 2015-11-12 ENCOUNTER — Encounter: Payer: Self-pay | Admitting: Obstetrics and Gynecology

## 2015-11-12 VITALS — BP 122/80 | HR 84 | Resp 16 | Ht 64.5 in | Wt 224.0 lb

## 2015-11-12 DIAGNOSIS — Z01419 Encounter for gynecological examination (general) (routine) without abnormal findings: Secondary | ICD-10-CM | POA: Diagnosis not present

## 2015-11-12 NOTE — Progress Notes (Signed)
50 y.o.  [id number] Married Caucasian female here for annual exam.    Laparoscopically assisted vaginal hysterectomy with bilateral salpingectomy in November 2015.  Adenomyosis and fibroids.  Hx abnormal uterine bleeding and fibroids.  Ovaries remain.  Hx prior endometrial ablation.   Hot flashes have now stopped.   Mother was diagnosed with two primary GYN cancers - uterine and ovarian cancer -  shortly after patient had her hysterectomy.  She is now deceased.   PCP:   Dr. Marijo File [facility]  Patient's last menstrual period was 02/06/2014.           Sexually active: Yes.    The current method of family planning is status post hysterectomy.    Exercising: No.  The patient does not participate in regular exercise at present. Smoker:  no  Health Maintenance: Pap:  2016 as per patient normal History of abnormal Pap:  Yes.   About 5 years ago.  Follow up paps were normal.  MMG:  07/28/15 BIRADS1 negative. Colonoscopy:  Never.   BMD:  As per patient about 10years ago? TDaP:  12/2014 Gardasil:   No HIV:  Tested with blood donation.  Hep C:  Tested with blood donation. Screening Labs:    Urine today: Normal   reports that she quit smoking about 24 years ago. Her smoking use included Cigarettes. She has never used smokeless tobacco. She reports that she drinks alcohol. She reports that she does not use drugs.  Past Medical History:  Diagnosis Date  . Asthma    no inhaler x 5 yrs  . Bulging lumbar disc   . Fibroid   . GERD (gastroesophageal reflux disease)   . Hyperlipidemia   . Hypertension 2013   no meds x 2 yrs    Past Surgical History:  Procedure Laterality Date  . CESAREAN SECTION     x 3  . LAPAROSCOPIC ASSISTED VAGINAL HYSTERECTOMY N/A 03/05/2014   Procedure: LAPAROSCOPIC ASSISTED VAGINAL HYSTERECTOMY bilateral Salpingectomy;  Surgeon: Eldred Manges, MD;  Location: [facility];  Service: Gynecology;  Laterality: N/A;  vaginal  . TONSILLECTOMY      Current  Outpatient Prescriptions  Medication Sig Dispense Refill  . amLODipine (NORVASC) 10 MG tablet Take 10 mg by mouth daily.    Marland Kitchen aspirin 81 MG tablet Take 81 mg by mouth daily.    Marland Kitchen atorvastatin (LIPITOR) 10 MG tablet Take 10 mg by mouth daily.    Marland Kitchen ibuprofen (ADVIL,MOTRIN) 600 MG tablet 1  po  pc every 6 hours for 5 days then prn-pain 30 tablet 1  . L-Lysine 500 MG TABS Take 500 mg by mouth.    . losartan (COZAAR) 50 MG tablet Take 50 mg by mouth daily.    . Multiple Vitamin (MULTIVITAMIN) tablet Take 1 tablet by mouth daily.    . Omega-3 Fatty Acids (MINI FISH OIL PO) Take 1,290 mg by mouth.    Marland Kitchen omeprazole (PRILOSEC) 20 MG capsule Take 20 mg by mouth daily.    . TURMERIC PO Take 1 capsule by mouth as needed.    . vitamin E 1000 UNIT capsule Take 1,000 Units by mouth daily.     No current facility-administered medications for this visit.     Family History  Problem Relation Age of Onset  . Diabetes Mother   . Endometrial cancer Mother   . Ovarian cancer Mother   . Stroke Father   . Hyperlipidemia Father   . Hypertension Father   . Cancer Father  lung  . Fibroids Sister     sarcoma.    Marland Kitchen Epilepsy Brother   . Hypertension Brother   . Hyperlipidemia Brother   . Arthritis Brother   . Alzheimer's disease Maternal Grandmother   . Diabetes Maternal Grandfather   . Stomach cancer Paternal Grandfather   . Brain cancer Paternal Aunt     ROS:  Pertinent items are noted in HPI.  Otherwise, a comprehensive ROS was negative.  Exam:   BP 122/80 (BP Location: Right Arm, Patient Position: Sitting, Cuff Size: Large)   Pulse 84   Resp 16   Ht 5' 4.5" (1.638 m)   Wt 224 lb (101.6 kg)   LMP 02/06/2014   BMI 37.86 kg/m     General appearance: alert, cooperative and appears stated age Head: Normocephalic, without obvious abnormality, atraumatic Neck: no adenopathy, supple, symmetrical, trachea midline and thyroid normal to inspection and palpation Lungs: clear to auscultation  bilaterally Breasts: normal appearance, no masses or tenderness, No nipple retraction or dimpling, No nipple discharge or bleeding, No axillary or supraclavicular adenopathy Heart: regular rate and rhythm Abdomen: soft, non-tender; no masses, no organomegaly Extremities: extremities normal, atraumatic, no cyanosis or edema Skin: Skin color, texture, turgor normal. No rashes or lesions Lymph nodes: Cervical, supraclavicular, and axillary nodes normal. No abnormal inguinal nodes palpated Neurologic: Grossly normal  Pelvic: External genitalia:  no lesions              Urethra:  normal appearing urethra with no masses, tenderness or lesions              Bartholins and Skenes: normal                 Vagina: normal appearing vagina with normal color and discharge, no lesions              Cervix:  absent              Pap taken: Yes.   Bimanual Exam:  Uterus:   Absent.              Adnexa: no mass, fullness, tenderness              Rectal exam: Yes.  .  Confirms.              Anus:  normal sphincter tone, no lesions  Chaperone was present for exam.  Assessment:   Well woman visit with normal exam. Status post LAVH/bilateral salpingectomy.  Hx abnormal pap. FH of uterine, ovarian, and sarcoma cancers.  Plan: Yearly mammogram recommended after age 55.  Recommended self breast exam.  Pap and HR HPV as above. Discussed Calcium, Vitamin D, regular exercise program including cardiovascular and weight bearing exercise. Discussed genetics counseling and testing.  Patient will consider.  Colonoscopy recommended.  Labs with PCP.  Will get last 5 years of records from prior GYN office. Follow up annually and prn.       After visit summary provided.

## 2015-11-12 NOTE — Patient Instructions (Signed)
Health Maintenance, Female Adopting a healthy lifestyle and getting preventive care can go a long way to promote health and wellness. Talk with your health care provider about what schedule of regular examinations is right for you. This is a good chance for you to check in with your provider about disease prevention and staying healthy. In between checkups, there are plenty of things you can do on your own. Experts have done a lot of research about which lifestyle changes and preventive measures are most likely to keep you healthy. Ask your health care provider for more information. WEIGHT AND DIET  Eat a healthy diet  Be sure to include plenty of vegetables, fruits, low-fat dairy products, and lean protein.  Do not eat a lot of foods high in solid fats, added sugars, or salt.  Get regular exercise. This is one of the most important things you can do for your health.  Most adults should exercise for at least 150 minutes each week. The exercise should increase your heart rate and make you sweat (moderate-intensity exercise).  Most adults should also do strengthening exercises at least twice a week. This is in addition to the moderate-intensity exercise.  Maintain a healthy weight  Body mass index (BMI) is a measurement that can be used to identify possible weight problems. It estimates body fat based on height and weight. Your health care provider can help determine your BMI and help you achieve or maintain a healthy weight.  For females 20 years of age and older:   A BMI below 18.5 is considered underweight.  A BMI of 18.5 to 24.9 is normal.  A BMI of 25 to 29.9 is considered overweight.  A BMI of 30 and above is considered obese.  Watch levels of cholesterol and blood lipids  You should start having your blood tested for lipids and cholesterol at 52 years of age, then have this test every 5 years.  You may need to have your cholesterol levels checked more often if:  Your lipid  or cholesterol levels are high.  You are older than 52 years of age.  You are at high risk for heart disease.  CANCER SCREENING   Lung Cancer  Lung cancer screening is recommended for adults 55-80 years old who are at high risk for lung cancer because of a history of smoking.  A yearly low-dose CT scan of the lungs is recommended for people who:  Currently smoke.  Have quit within the past 15 years.  Have at least a 30-pack-year history of smoking. A pack year is smoking an average of one pack of cigarettes a day for 1 year.  Yearly screening should continue until it has been 15 years since you quit.  Yearly screening should stop if you develop a health problem that would prevent you from having lung cancer treatment.  Breast Cancer  Practice breast self-awareness. This means understanding how your breasts normally appear and feel.  It also means doing regular breast self-exams. Let your health care provider know about any changes, no matter how small.  If you are in your 20s or 30s, you should have a clinical breast exam (CBE) by a health care provider every 1-3 years as part of a regular health exam.  If you are 40 or older, have a CBE every year. Also consider having a breast X-ray (mammogram) every year.  If you have a family history of breast cancer, talk to your health care provider about genetic screening.  If you   are at high risk for breast cancer, talk to your health care provider about having an MRI and a mammogram every year.  Breast cancer gene (BRCA) assessment is recommended for women who have family members with BRCA-related cancers. BRCA-related cancers include:  Breast.  Ovarian.  Tubal.  Peritoneal cancers.  Results of the assessment will determine the need for genetic counseling and BRCA1 and BRCA2 testing. Cervical Cancer Your health care provider may recommend that you be screened regularly for cancer of the pelvic organs (ovaries, uterus, and  vagina). This screening involves a pelvic examination, including checking for microscopic changes to the surface of your cervix (Pap test). You may be encouraged to have this screening done every 3 years, beginning at age 21.  For women ages 30-65, health care providers may recommend pelvic exams and Pap testing every 3 years, or they may recommend the Pap and pelvic exam, combined with testing for human papilloma virus (HPV), every 5 years. Some types of HPV increase your risk of cervical cancer. Testing for HPV may also be done on women of any age with unclear Pap test results.  Other health care providers may not recommend any screening for nonpregnant women who are considered low risk for pelvic cancer and who do not have symptoms. Ask your health care provider if a screening pelvic exam is right for you.  If you have had past treatment for cervical cancer or a condition that could lead to cancer, you need Pap tests and screening for cancer for at least 20 years after your treatment. If Pap tests have been discontinued, your risk factors (such as having a new sexual partner) need to be reassessed to determine if screening should resume. Some women have medical problems that increase the chance of getting cervical cancer. In these cases, your health care provider may recommend more frequent screening and Pap tests. Colorectal Cancer  This type of cancer can be detected and often prevented.  Routine colorectal cancer screening usually begins at 52 years of age and continues through 52 years of age.  Your health care provider may recommend screening at an earlier age if you have risk factors for colon cancer.  Your health care provider may also recommend using home test kits to check for hidden blood in the stool.  A small camera at the end of a tube can be used to examine your colon directly (sigmoidoscopy or colonoscopy). This is done to check for the earliest forms of colorectal  cancer.  Routine screening usually begins at age 50.  Direct examination of the colon should be repeated every 5-10 years through 52 years of age. However, you may need to be screened more often if early forms of precancerous polyps or small growths are found. Skin Cancer  Check your skin from head to toe regularly.  Tell your health care provider about any new moles or changes in moles, especially if there is a change in a mole's shape or color.  Also tell your health care provider if you have a mole that is larger than the size of a pencil eraser.  Always use sunscreen. Apply sunscreen liberally and repeatedly throughout the day.  Protect yourself by wearing long sleeves, pants, a wide-brimmed hat, and sunglasses whenever you are outside. HEART DISEASE, DIABETES, AND HIGH BLOOD PRESSURE   High blood pressure causes heart disease and increases the risk of stroke. High blood pressure is more likely to develop in:  People who have blood pressure in the high end   of the normal range (130-139/85-89 mm Hg).  People who are overweight or obese.  People who are African American.  If you are 38-23 years of age, have your blood pressure checked every 3-5 years. If you are 61 years of age or older, have your blood pressure checked every year. You should have your blood pressure measured twice--once when you are at a hospital or clinic, and once when you are not at a hospital or clinic. Record the average of the two measurements. To check your blood pressure when you are not at a hospital or clinic, you can use:  An automated blood pressure machine at a pharmacy.  A home blood pressure monitor.  If you are between 45 years and 39 years old, ask your health care provider if you should take aspirin to prevent strokes.  Have regular diabetes screenings. This involves taking a blood sample to check your fasting blood sugar level.  If you are at a normal weight and have a low risk for diabetes,  have this test once every three years after 52 years of age.  If you are overweight and have a high risk for diabetes, consider being tested at a younger age or more often. PREVENTING INFECTION  Hepatitis B  If you have a higher risk for hepatitis B, you should be screened for this virus. You are considered at high risk for hepatitis B if:  You were born in a country where hepatitis B is common. Ask your health care provider which countries are considered high risk.  Your parents were born in a high-risk country, and you have not been immunized against hepatitis B (hepatitis B vaccine).  You have HIV or AIDS.  You use needles to inject street drugs.  You live with someone who has hepatitis B.  You have had sex with someone who has hepatitis B.  You get hemodialysis treatment.  You take certain medicines for conditions, including cancer, organ transplantation, and autoimmune conditions. Hepatitis C  Blood testing is recommended for:  Everyone born from [birth year] through 1967.  Anyone with known risk factors for hepatitis C. Sexually transmitted infections (STIs)  You should be screened for sexually transmitted infections (STIs) including gonorrhea and chlamydia if:  You are sexually active and are younger than 52 years of age.  You are older than 53 years of age and your health care provider tells you that you are at risk for this type of infection.  Your sexual activity has changed since you were last screened and you are at an increased risk for chlamydia or gonorrhea. Ask your health care provider if you are at risk.  If you do not have HIV, but are at risk, it may be recommended that you take a prescription medicine daily to prevent HIV infection. This is called pre-exposure prophylaxis (PrEP). You are considered at risk if:  You are sexually active and do not regularly use condoms or know the HIV status of your partner(s).  You take drugs by injection.  You are sexually  active with a partner who has HIV. Talk with your health care provider about whether you are at high risk of being infected with HIV. If you choose to begin PrEP, you should first be tested for HIV. You should then be tested every 3 months for as long as you are taking PrEP.  PREGNANCY   If you are premenopausal and you may become pregnant, ask your health care provider about preconception counseling.  If you may  become pregnant, take 400 to 800 micrograms (mcg) of folic acid every day.  If you want to prevent pregnancy, talk to your health care provider about birth control (contraception). OSTEOPOROSIS AND MENOPAUSE   Osteoporosis is a disease in which the bones lose minerals and strength with aging. This can result in serious bone fractures. Your risk for osteoporosis can be identified using a bone density scan.  If you are 61 years of age or older, or if you are at risk for osteoporosis and fractures, ask your health care provider if you should be screened.  Ask your health care provider whether you should take a calcium or vitamin D supplement to lower your risk for osteoporosis.  Menopause may have certain physical symptoms and risks.  Hormone replacement therapy may reduce some of these symptoms and risks. Talk to your health care provider about whether hormone replacement therapy is right for you.  HOME CARE INSTRUCTIONS   Schedule regular health, dental, and eye exams.  Stay current with your immunizations.   Do not use any tobacco products including cigarettes, chewing tobacco, or electronic cigarettes.  If you are pregnant, do not drink alcohol.  If you are breastfeeding, limit how much and how often you drink alcohol.  Limit alcohol intake to no more than 1 drink per day for nonpregnant women. One drink equals 12 ounces of beer, 5 ounces of wine, or 1 ounces of hard liquor.  Do not use street drugs.  Do not share needles.  Ask your health care provider for help if  you need support or information about quitting drugs.  Tell your health care provider if you often feel depressed.  Tell your health care provider if you have ever been abused or do not feel safe at home.   This information is not intended to replace advice given to you by your health care provider. Make sure you discuss any questions you have with your health care provider.   Document Released: 10/04/2010 Document Revised: 04/11/2014 Document Reviewed: 02/20/2013 Elsevier Interactive Patient Education [employer].

## 2015-11-16 LAB — IPS PAP TEST WITH HPV

## 2016-02-03 DIAGNOSIS — L959 Vasculitis limited to the skin, unspecified: Secondary | ICD-10-CM | POA: Diagnosis not present

## 2016-02-29 DIAGNOSIS — R208 Other disturbances of skin sensation: Secondary | ICD-10-CM | POA: Diagnosis not present

## 2016-03-29 DIAGNOSIS — M5136 Other intervertebral disc degeneration, lumbar region: Secondary | ICD-10-CM | POA: Diagnosis not present

## 2016-06-28 DIAGNOSIS — E782 Mixed hyperlipidemia: Secondary | ICD-10-CM | POA: Diagnosis not present

## 2016-06-28 DIAGNOSIS — I10 Essential (primary) hypertension: Secondary | ICD-10-CM | POA: Diagnosis not present

## 2016-06-28 DIAGNOSIS — Z0000 Encounter for general adult medical examination without abnormal findings: Secondary | ICD-10-CM | POA: Diagnosis not present

## 2016-06-28 DIAGNOSIS — K219 Gastro-esophageal reflux disease without esophagitis: Secondary | ICD-10-CM | POA: Diagnosis not present

## 2016-06-28 DIAGNOSIS — R7303 Prediabetes: Secondary | ICD-10-CM | POA: Diagnosis not present

## 2016-08-30 ENCOUNTER — Other Ambulatory Visit: Payer: Self-pay | Admitting: Obstetrics and Gynecology

## 2016-08-30 DIAGNOSIS — Z1231 Encounter for screening mammogram for malignant neoplasm of breast: Secondary | ICD-10-CM

## 2016-09-19 ENCOUNTER — Ambulatory Visit
Admission: RE | Admit: 2016-09-19 | Discharge: 2016-09-19 | Disposition: A | Discharge status: Home / Self Care | Payer: BLUE CROSS/BLUE SHIELD | Source: Ambulatory Visit | Attending: Obstetrics and Gynecology | Admitting: Obstetrics and Gynecology

## 2016-09-19 DIAGNOSIS — Z1231 Encounter for screening mammogram for malignant neoplasm of breast: Secondary | ICD-10-CM | POA: Diagnosis not present

## 2016-10-10 DIAGNOSIS — Z1211 Encounter for screening for malignant neoplasm of colon: Secondary | ICD-10-CM | POA: Diagnosis not present

## 2016-11-21 ENCOUNTER — Encounter: Payer: Self-pay | Admitting: Obstetrics and Gynecology

## 2016-11-21 ENCOUNTER — Ambulatory Visit (INDEPENDENT_AMBULATORY_CARE_PROVIDER_SITE_OTHER): Payer: BLUE CROSS/BLUE SHIELD | Admitting: Obstetrics and Gynecology

## 2016-11-21 VITALS — BP 130/78 | HR 84 | Resp 14 | Ht 64.0 in | Wt 233.4 lb

## 2016-11-21 DIAGNOSIS — Z01419 Encounter for gynecological examination (general) (routine) without abnormal findings: Secondary | ICD-10-CM | POA: Diagnosis not present

## 2016-11-21 NOTE — Patient Instructions (Signed)
Menopause and Herbal Products What is menopause? Menopause is the normal time of life when menstrual periods decrease in frequency and eventually stop completely. This process can take several years for some women. Menopause is complete when you have had an absence of menstruation for a full year since your last menstrual period. It usually occurs between the ages of 48 and 55. It is not common for menopause to begin before the age of 40. During menopause, your body stops producing the female hormones estrogen and progesterone. Common symptoms associated with this loss of hormones (vasomotor symptoms) are:  Hot flashes.  Hot flushes.  Night sweats.  Other common symptoms and complications of menopause include:  Decrease in sex drive.  Vaginal dryness and thinning of the walls of the vagina. This can make sex painful.  Dryness of the skin and development of wrinkles.  Headaches.  Tiredness.  Irritability.  Memory problems.  Weight gain.  Bladder infections.  Hair growth on the face and chest.  Inability to reproduce offspring (infertility).  Loss of density in the bones (osteoporosis) increasing your risk for breaks (fractures).  Depression.  Hardening and narrowing of the arteries (atherosclerosis). This increases your risk of heart attack and stroke.  What treatment options are available? There are many treatment choices for menopause symptoms. The most common treatment is hormone replacement therapy. Many alternative therapies for menopause are emerging, including the use of herbal products. These supplements can be found in the form of herbs, teas, oils, tinctures, and pills. Common herbal supplements for menopause are made from plants that contain phytoestrogens. Phytoestrogens are compounds that occur naturally in plants and plant products. They act like estrogen in the body. Foods and herbs that contain phytoestrogens include:  Soy.  Flax seeds.  Red  clover.  Ginseng.  What menopause symptoms may be helped if I use herbal products?  Vasomotor symptoms. These may be helped by: ? Soy. Some studies show that soy may have a moderate benefit for hot flashes. ? Black cohosh. There is limited evidence indicating this may be beneficial for hot flashes.  Symptoms that are related to heart and blood vessel disease. These may be helped by soy. Studies have shown that soy can help to lower cholesterol.  Depression. This may be helped by: ? St. John's wort. There is limited evidence that shows this may help mild to moderate depression. ? Black cohosh. There is evidence that this may help depression and mood swings.  Osteoporosis. Soy may help to decrease bone loss that is associated with menopause and may prevent osteoporosis. Limited evidence indicates that red clover may offer some bone loss protection as well. Other herbal products that are commonly used during menopause lack enough evidence to support their use as a replacement for conventional menopause therapies. These products include evening primrose, ginseng, and red clover. What are the cases when herbal products should not be used during menopause? Do not use herbal products during menopause without your health care provider's approval if:  You are taking medicine.  You have a preexisting liver condition.  Are there any risks in my taking herbal products during menopause? If you choose to use herbal products to help with symptoms of menopause, keep in mind that:  Different supplements have different and unmeasured amounts of herbal ingredients.  Herbal products are not regulated the same way that medicines are.  Concentrations of herbs may vary depending on the way they are prepared. For example, the concentration may be different in a pill,   tea, oil, and tincture.  Little is known about the risks of using herbal products, particularly the risks of long-term use.  Some herbal  supplements can be harmful when combined with certain medicines.  Most commonly reported side effects of herbal products are mild. However, if used improperly, many herbal supplements can cause serious problems. Talk to your health care provider before starting any herbal product. If problems develop, stop taking the supplement and let your health care provider know. This information is not intended to replace advice given to you by your health care provider. Make sure you discuss any questions you have with your health care provider. Document Released: 09/07/2007 Document Revised: 02/16/2016 Document Reviewed: 09/03/2013 Elsevier Interactive Patient Education  2017 [employer]  EXERCISE AND DIET:  We recommended that you start or continue a regular exercise program for good health. Regular exercise means any activity that makes your heart beat faster and makes you sweat.  We recommend exercising at least 30 minutes per day at least 3 days a week, preferably 4 or 5.  We also recommend a diet low in fat and sugar.  Inactivity, poor dietary choices and obesity can cause diabetes, heart attack, stroke, and kidney damage, among others.    ALCOHOL AND SMOKING:  Women should limit their alcohol intake to no more than 7 drinks/beers/glasses of wine (combined, not each!) per week. Moderation of alcohol intake to this level decreases your risk of breast cancer and liver damage. And of course, no recreational drugs are part of a healthy lifestyle.  And absolutely no smoking or even second hand smoke. Most people know smoking can cause heart and lung diseases, but did you know it also contributes to weakening of your bones? Aging of your skin?  Yellowing of your teeth and nails?  CALCIUM AND VITAMIN D:  Adequate intake of calcium and Vitamin D are recommended.  The recommendations for exact amounts of these supplements seem to change often, but generally speaking 600 mg of calcium (either carbonate or citrate)  and 800 units of Vitamin D per day seems prudent. Certain women may benefit from higher intake of Vitamin D.  If you are among these women, your doctor will have told you during your visit.    PAP SMEARS:  Pap smears, to check for cervical cancer or precancers,  have traditionally been done yearly, although recent scientific advances have shown that most women can have pap smears less often.  However, every woman still should have a physical exam from her gynecologist every year. It will include a breast check, inspection of the vulva and vagina to check for abnormal growths or skin changes, a visual exam of the cervix, and then an exam to evaluate the size and shape of the uterus and ovaries.  And after 53 years of age, a rectal exam is indicated to check for rectal cancers. We will also provide age appropriate advice regarding health maintenance, like when you should have certain vaccines, screening for sexually transmitted diseases, bone density testing, colonoscopy, mammograms, etc.   MAMMOGRAMS:  All women over 40 years old should have a yearly mammogram. Many facilities now offer a "3D" mammogram, which may cost around $50 extra out of pocket. If possible,  we recommend you accept the option to have the 3D mammogram performed.  It both reduces the number of women who will be called back for extra views which then turn out to be normal, and it is better than the routine mammogram at detecting truly   abnormal areas.    COLONOSCOPY:  Colonoscopy to screen for colon cancer is recommended for all women at age 50.  We know, you hate the idea of the prep.  We agree, BUT, having colon cancer and not knowing it is worse!!  Colon cancer so often starts as a polyp that can be seen and removed at colonscopy, which can quite literally save your life!  And if your first colonoscopy is normal and you have no family history of colon cancer, most women don't have to have it again for 10 years.  Once every ten years, you  can do something that may end up saving your life, right?  We will be happy to help you get it scheduled when you are ready.  Be sure to check your insurance coverage so you understand how much it will cost.  It may be covered as a preventative service at no cost, but you should check your particular policy.       

## 2016-11-21 NOTE — Progress Notes (Signed)
51 y.o. G61P3003 Married Caucasian female here for annual exam.    Patient still has her ovaries.   Having hot flashes, especially for the last 8 months.  Does not like them.  Not sleeping well.   [place] and is having chronic pain and some decreased sensation in her left leg.  Has spinal fusion noted through MRI.   Has coloration change in her lower extremities.   Occasional stress incontinence.   Daughter went off to [facility] recently.   PCP: Antony Contras, MD  Patient's last menstrual period was 02/06/2014.           Sexually active: Yes.   female The current method of family planning is status post hysterectomy.  Ovaries remain Exercising: No.   Smoker:  no  Health Maintenance: Pap: 2016 normal per patient History of abnormal Pap:  Yes, 2012 abnormal pap but follow up paps were normal. MMG: 09-19-16 Density B/Neg/BiRads1:TBC Colonoscopy:  10/2016 normal with [facility];next 7-8 years due to family history of brother with multiple polyps. BMD:   n/a  Result  n/a TDaP:  12/2014 Gardasil:   no HIV: Tested with blood donation Hep C: Tested with blood donation Screening Labs:  With PCP.   reports that she quit smoking about 25 years ago. Her smoking use included Cigarettes. She has never used smokeless tobacco. She reports that she drinks about 0.6 oz of alcohol per week . She reports that she does not use drugs.  Past Medical History:  Diagnosis Date  . Asthma    no inhaler x 5 yrs  . Bulging lumbar disc   . Fibroid   . GERD (gastroesophageal reflux disease)   . Hyperlipidemia   . Hypertension 2013   no meds x 2 yrs    Past Surgical History:  Procedure Laterality Date  . CESAREAN SECTION     x 3  . LAPAROSCOPIC ASSISTED VAGINAL HYSTERECTOMY N/A 03/05/2014   Procedure: LAPAROSCOPIC ASSISTED VAGINAL HYSTERECTOMY bilateral Salpingectomy;  Surgeon: Eldred Manges, MD;  Location: [facility];  Service: Gynecology;  Laterality: N/A;  vaginal  . TONSILLECTOMY      Current  Outpatient Prescriptions  Medication Sig Dispense Refill  . amLODipine (NORVASC) 10 MG tablet Take 10 mg by mouth daily.    Marland Kitchen aspirin 81 MG tablet Take 81 mg by mouth daily.    Marland Kitchen atorvastatin (LIPITOR) 10 MG tablet Take 10 mg by mouth daily.    Marland Kitchen ibuprofen (ADVIL,MOTRIN) 600 MG tablet 1  po  pc every 6 hours for 5 days then prn-pain 30 tablet 1  . L-Lysine 500 MG TABS Take 500 mg by mouth.    . losartan (COZAAR) 50 MG tablet Take 50 mg by mouth daily.    . Multiple Vitamin (MULTIVITAMIN) tablet Take 1 tablet by mouth daily.    . Omega-3 Fatty Acids (MINI FISH OIL PO) Take 1,290 mg by mouth.    Marland Kitchen omeprazole (PRILOSEC) 20 MG capsule Take 20 mg by mouth daily.    . TURMERIC PO Take 1 capsule by mouth as needed.    . vitamin E 1000 UNIT capsule Take 1,000 Units by mouth daily.     No current facility-administered medications for this visit.     Family History  Problem Relation Age of Onset  . Diabetes Mother   . Endometrial cancer Mother   . Ovarian cancer Mother   . Stroke Father   . Hyperlipidemia Father   . Hypertension Father   . Cancer Father  lung  . Fibroids Sister        sarcoma.    Marland Kitchen Epilepsy Brother   . Hypertension Brother   . Hyperlipidemia Brother   . Arthritis Brother   . Alzheimer's disease Maternal Grandmother   . Diabetes Maternal Grandfather   . Stomach cancer Paternal Grandfather   . Brain cancer Paternal Aunt     ROS:  Pertinent items are noted in HPI.  Otherwise, a comprehensive ROS was negative.  Exam:   BP 130/78 (BP Location: Right Arm, Patient Position: Sitting, Cuff Size: Large)   Pulse 84   Resp 14   Ht 5\' 4"  (1.626 m)   Wt 233 lb 6.4 oz (105.9 kg)   LMP 02/06/2014   BMI 40.06 kg/m     General appearance: alert, cooperative and appears stated age Head: Normocephalic, without obvious abnormality, atraumatic Neck: no adenopathy, supple, symmetrical, trachea midline and thyroid normal to inspection and palpation Lungs: clear to  auscultation bilaterally Breasts: normal appearance, no masses or tenderness, No nipple retraction or dimpling, No nipple discharge or bleeding, No axillary or supraclavicular adenopathy Heart: regular rate and rhythm Abdomen: soft, non-tender; no masses, no organomegaly Extremities: extremities normal, atraumatic, no cyanosis.  Has skin color changes in her lower extremities. Skin: Skin color, texture, turgor normal. No rashes or lesions Lymph nodes: Cervical, supraclavicular, and axillary nodes normal. No abnormal inguinal nodes palpated Neurologic: Grossly normal  Pelvic: External genitalia:  no lesions              Urethra:  normal appearing urethra with no masses, tenderness or lesions              Bartholins and Skenes: normal                 Vagina: normal appearing vagina with normal color and discharge, no lesions              Cervix:  absent.               Pap taken: No. Bimanual Exam:  Uterus:   Absent.              Adnexa: no mass, fullness, tenderness              Rectal exam: Yes.  .  Confirms.              Anus:  normal sphincter tone, no lesions  Chaperone was present for exam.  Assessment:   Well woman visit with normal exam. Status post LAVH/bilateral salpingectomy.  Hx abnormal pap. FH of uterine, ovarian, and sarcoma cancers. Venous stasis of lower extremities?  Mild GSI. Menopausal symptoms.  FH stroke.  Plan: Mammogram screening discussed. Recommended self breast awareness. Pap and HR HPV as above. Guidelines for Calcium, Vitamin D, regular exercise program including cardiovascular and weight bearing exercise. I would like for her to see her PCP and request cardiology consultation regarding her legs.  Discussed Gabapentin, SSRIs, SNRIs, and herbal options.  She chooses a natural approach and will try herbal remedies.  I am not recommending estrogen treatment at this time.  Kegel's. She declines genetic counseling and testing. Declines ovarian  ultrasound. Follow up annually and prn.   After visit summary provided.

## 2016-12-27 DIAGNOSIS — I10 Essential (primary) hypertension: Secondary | ICD-10-CM | POA: Diagnosis not present

## 2016-12-27 DIAGNOSIS — K219 Gastro-esophageal reflux disease without esophagitis: Secondary | ICD-10-CM | POA: Diagnosis not present

## 2016-12-27 DIAGNOSIS — R7303 Prediabetes: Secondary | ICD-10-CM | POA: Diagnosis not present

## 2016-12-27 DIAGNOSIS — E782 Mixed hyperlipidemia: Secondary | ICD-10-CM | POA: Diagnosis not present

## 2016-12-27 DIAGNOSIS — Z23 Encounter for immunization: Secondary | ICD-10-CM | POA: Diagnosis not present

## 2017-01-12 ENCOUNTER — Telehealth: Payer: Self-pay | Admitting: Obstetrics and Gynecology

## 2017-01-12 DIAGNOSIS — M79605 Pain in left leg: Secondary | ICD-10-CM

## 2017-01-12 DIAGNOSIS — M79604 Pain in right leg: Secondary | ICD-10-CM

## 2017-01-12 DIAGNOSIS — I878 Other specified disorders of veins: Secondary | ICD-10-CM

## 2017-01-12 NOTE — Telephone Encounter (Signed)
Spoke with patient, advised as seen below per Dr. Quincy Simmonds. Patient accepts referral, order placed in EPIC. Advised our referral department would f/u with scheduling. Patient verbalizes understanding and is agreeable.  Patient is agreeable to disposition. Will close encounter.   Cc: Magdalene Patricia, [address] [employer]

## 2017-01-12 NOTE — Telephone Encounter (Signed)
Patient wants to see if Dr. Quincy Simmonds will help her with a referral for circulation for leg pain.

## 2017-01-12 NOTE — Telephone Encounter (Signed)
Spoke with patient. Patient states she spoke with PCP regarding cardiology referral and was recommended to f/u with dermatology. Patient states she is concerned and asking if Dr. Quincy Simmonds will make cardiology referral for her legs.  Advised patient of Dr. Elza Rafter previous recommendations per OV dated 11/21/16 "I would like for her to see her PCP and request cardiology consultation regarding her legs".   Advised patient would review with Dr. Quincy Simmonds and return call with any additional recommendations. Patient verbalizes understanding and is agreeable.  Dr. Quincy Simmonds -please review.

## 2017-01-12 NOTE — Telephone Encounter (Signed)
Ok to make referral to cardiology for LE venous stasis and leg pain.  She can see first available at [facility].

## 2017-01-17 ENCOUNTER — Encounter: Payer: Self-pay | Admitting: Cardiovascular Disease

## 2017-01-17 ENCOUNTER — Ambulatory Visit (INDEPENDENT_AMBULATORY_CARE_PROVIDER_SITE_OTHER): Payer: BLUE CROSS/BLUE SHIELD | Admitting: Cardiovascular Disease

## 2017-01-17 VITALS — BP 132/86 | HR 94 | Ht 64.0 in | Wt 235.0 lb

## 2017-01-17 DIAGNOSIS — I878 Other specified disorders of veins: Secondary | ICD-10-CM

## 2017-01-17 DIAGNOSIS — I872 Venous insufficiency (chronic) (peripheral): Secondary | ICD-10-CM | POA: Diagnosis not present

## 2017-01-17 DIAGNOSIS — E7800 Pure hypercholesterolemia, unspecified: Secondary | ICD-10-CM

## 2017-01-17 DIAGNOSIS — I10 Essential (primary) hypertension: Secondary | ICD-10-CM | POA: Diagnosis not present

## 2017-01-17 HISTORY — DX: Venous insufficiency (chronic) (peripheral): I87.2

## 2017-01-17 HISTORY — DX: Morbid (severe) obesity due to excess calories: E66.01

## 2017-01-17 HISTORY — DX: Pure hypercholesterolemia, unspecified: E78.00

## 2017-01-17 HISTORY — DX: Essential (primary) hypertension: I10

## 2017-01-17 NOTE — Patient Instructions (Signed)
Medication Instructions:  Your physician recommends that you continue on your current medications as directed. Please refer to the Current Medication list given to you today.  Labwork: none  Testing/Procedures: Your physician has requested that you have a lower or upper extremity venous duplex. This test is an ultrasound of the veins in the legs or arms. It looks at venous blood flow that carries blood from the heart to the legs or arms. Allow one hour for a Lower Venous exam. Allow thirty minutes for an Upper Venous exam. There are no restrictions or special instructions. Lower extremity   Follow-Up: As needed   Any Other Special Instructions Will Be Listed Below (If Applicable). Compression stockings recommended

## 2017-01-17 NOTE — Progress Notes (Signed)
Cardiology Office Note   Date:  01/17/2017   ID:  Natalie Schmitt, DOB 12-Dec-1965, MRN [id number]  PCP:  Natalie Contras, MD  Cardiologist:   Natalie Latch, MD   No chief complaint on file.     History of Present Illness: Natalie Schmitt is a 51 y.o. female with hypertension and hyperlipidemia, and asthma who presents for an evaluation of lower extremity edema.  For years [name] has noted redness and itching in her legs after walking for prolonged periods of time.  If she walks over 3/4 mile this occurs.  She doesn't experience edema but her legs get tight.  She has not been exercising regularly due to this discomfort and chronic back pain.  In the past it got better after she stopped walking.  However, after a recent trip to [place] the skin went from red to [place], so she became concerned.  She notes that her mother had similar findings.  She tried wearing compression stockings and it seemed to help.  She hasn't experienced any orthopnea or PND.  She has exertional dyspnea that is unchanged.  She denies chest pain or pressure.  She checks her BP at home and it is typically in the 120s/80s.   She no longer has asthma.  This occurred when pregnant and has improved with the treatment of acid reflux.     Past Medical History:  Diagnosis Date  . Asthma    no inhaler x 5 yrs  . Bulging lumbar disc   . Essential hypertension 01/17/2017  . Fibroid   . GERD (gastroesophageal reflux disease)   . Hyperlipidemia   . Hypertension 2013   no meds x 2 yrs  . Morbid obesity ([place]) 01/17/2017  . Pure hypercholesterolemia 01/17/2017  . Stasis dermatitis 01/17/2017    Past Surgical History:  Procedure Laterality Date  . CESAREAN SECTION     x 3  . LAPAROSCOPIC ASSISTED VAGINAL HYSTERECTOMY N/A 03/05/2014   Procedure: LAPAROSCOPIC ASSISTED VAGINAL HYSTERECTOMY bilateral Salpingectomy;  Surgeon: Eldred Manges, MD;  Location: [facility];  Service: Gynecology;  Laterality: N/A;  vaginal  .  TONSILLECTOMY       Current Outpatient Prescriptions  Medication Sig Dispense Refill  . amLODipine (NORVASC) 10 MG tablet Take 10 mg by mouth daily.    Marland Kitchen aspirin 81 MG tablet Take 81 mg by mouth daily.    Marland Kitchen atorvastatin (LIPITOR) 10 MG tablet Take 10 mg by mouth daily.    Marland Kitchen ibuprofen (ADVIL,MOTRIN) 600 MG tablet 1  po  pc every 6 hours for 5 days then prn-pain 30 tablet 1  . L-Lysine 500 MG TABS Take 500 mg by mouth.    . lansoprazole (PREVACID) 15 MG capsule Take 15 mg by mouth daily at 12 noon.    Marland Kitchen losartan (COZAAR) 50 MG tablet Take 50 mg by mouth daily.    . Multiple Vitamin (MULTIVITAMIN) tablet Take 1 tablet by mouth daily.    . Omega-3 Fatty Acids (MINI FISH OIL PO) Take 1,290 mg by mouth.    . TURMERIC PO Take 1 capsule by mouth as needed.    . vitamin E 1000 UNIT capsule Take 1,000 Units by mouth daily.     No current facility-administered medications for this visit.     Allergies:   Patient has no known allergies.    Social History:  The patient  reports that she quit smoking about 25 years ago. Her smoking use included Cigarettes. She has never used  smokeless tobacco. She reports that she drinks about 0.6 oz of alcohol per week . She reports that she does not use drugs.   Family History:  The patient's family history includes Alzheimer's disease in her maternal grandmother; Arthritis in her brother; Brain cancer in her paternal aunt; Cancer in her father; Diabetes in her maternal grandfather and mother; Endometrial cancer in her mother; Epilepsy in her brother; Fibroids in her sister; Hyperlipidemia in her brother and father; Hypertension in her brother and father; Ovarian cancer in her mother; Stomach cancer in her paternal grandfather; Stroke in her father.    ROS:  Please see the history of present illness.   Otherwise, review of systems are positive for none.   All other systems are reviewed and negative.    PHYSICAL EXAM: VS:  BP 132/86   Pulse 94   Ht 5\' 4"  (1.626  m)   Wt 106.6 kg (235 lb)   LMP 02/06/2014   BMI 40.34 kg/m  , BMI Body mass index is 40.34 kg/m. GENERAL:  Well appearing HEENT:  Pupils equal round and reactive, fundi not visualized, oral mucosa unremarkable NECK:  No jugular venous distention, waveform within normal limits, carotid upstroke brisk and symmetric, no bruits LUNGS:  Clear to auscultation bilaterally HEART:  RRR.  PMI not displaced or sustained,S1 and S2 within normal limits, no S3, no S4, no clicks, no rubs, no murmurs ABD:  Flat, positive bowel sounds normal in frequency in pitch, no bruits, no rebound, no guarding, no midline pulsatile mass, no hepatomegaly, no splenomegaly EXT:  2 plus pulses throughout, no edema, no cyanosis no clubbing SKIN:  Hyperpigmentation and non-blanching petechiae on the lower tibia bilaterally  NEURO:  Cranial nerves II through XII grossly intact, motor grossly intact throughout PSYCH:  Cognitively intact, oriented to person place and time   EKG:  EKG is ordered today. The ekg ordered today demonstrates sinus rhythm. Rate 94 bpm.   Recent Labs: No results found for requested labs within last 8760 hours.   12/27/16: Sodium 143, potassium 4.6, BUN 12, creatinine 0.78 AST 16, ALT 23 Hemoglobin A1c 5.9% Total cholesterol 199, HDL 55, LDL 124   Lipid Panel No results found for: CHOL, TRIG, HDL, CHOLHDL, VLDL, LDLCALC, LDLDIRECT    Wt Readings from Last 3 Encounters:  01/17/17 106.6 kg (235 lb)  11/21/16 105.9 kg (233 lb 6.4 oz)  11/12/15 101.6 kg (224 lb)      ASSESSMENT AND PLAN:  # Stasis dermatitis: Her hyperpigmentation seems to be due to stasis dermatitis, though she hasn't ever experienced significant edema.  I recommended that she wear compression stockings and we will get LE venous Dopplers to assess for reflux.  She has no evidence of heart failure, so no echo needed.  We discussed limiting salt intake, and avoiding prolonged sitting or walking.  Elevate legs when sitting.   Recommended that she increase her exercise and weight loss will also help.   # Hypertension: Blood pressure is above goal here but has been well-controlled at home. Continue amlodipine and losartan.  #Hyperlipidemia: Continue atorvastatin and fish oil.   Current medicines are reviewed at length with the patient today.  The patient does not have concerns regarding medicines.  The following changes have been made:  no change  Labs/ tests ordered today include:   Orders Placed This Encounter  Procedures  . EKG 12-Lead     Disposition:   FU with Reginia Battie C. Oval Linsey, MD, [facility] as needed.  This note was written with the assistance of speech recognition software.  Please excuse any transcriptional errors.  Signed, Shaquil Aldana C. Oval Linsey, MD, [facility]  01/17/2017 9:26 AM    [facility]

## 2017-01-19 ENCOUNTER — Ambulatory Visit (HOSPITAL_COMMUNITY)
Admission: RE | Admit: 2017-01-19 | Payer: BLUE CROSS/BLUE SHIELD | Source: Ambulatory Visit | Attending: Cardiovascular Disease | Admitting: Cardiovascular Disease

## 2017-01-19 ENCOUNTER — Ambulatory Visit (HOSPITAL_COMMUNITY)
Admission: RE | Admit: 2017-01-19 | Discharge: 2017-01-19 | Disposition: A | Discharge status: Home / Self Care | Payer: BLUE CROSS/BLUE SHIELD | Source: Ambulatory Visit | Attending: Cardiology | Admitting: Cardiology

## 2017-01-19 DIAGNOSIS — I878 Other specified disorders of veins: Secondary | ICD-10-CM

## 2017-04-11 DIAGNOSIS — H1045 Other chronic allergic conjunctivitis: Secondary | ICD-10-CM | POA: Diagnosis not present

## 2017-07-11 DIAGNOSIS — R7303 Prediabetes: Secondary | ICD-10-CM | POA: Diagnosis not present

## 2017-07-11 DIAGNOSIS — I10 Essential (primary) hypertension: Secondary | ICD-10-CM | POA: Diagnosis not present

## 2017-07-11 DIAGNOSIS — E782 Mixed hyperlipidemia: Secondary | ICD-10-CM | POA: Diagnosis not present

## 2017-07-11 DIAGNOSIS — K219 Gastro-esophageal reflux disease without esophagitis: Secondary | ICD-10-CM | POA: Diagnosis not present

## 2017-07-11 DIAGNOSIS — Z0000 Encounter for general adult medical examination without abnormal findings: Secondary | ICD-10-CM | POA: Diagnosis not present

## 2017-10-10 ENCOUNTER — Other Ambulatory Visit: Payer: Self-pay | Admitting: Obstetrics and Gynecology

## 2017-10-10 DIAGNOSIS — Z1231 Encounter for screening mammogram for malignant neoplasm of breast: Secondary | ICD-10-CM

## 2017-10-31 ENCOUNTER — Ambulatory Visit
Admission: RE | Admit: 2017-10-31 | Discharge: 2017-10-31 | Disposition: A | Discharge status: Home / Self Care | Payer: BLUE CROSS/BLUE SHIELD | Source: Ambulatory Visit | Attending: Obstetrics and Gynecology | Admitting: Obstetrics and Gynecology

## 2017-10-31 DIAGNOSIS — Z1231 Encounter for screening mammogram for malignant neoplasm of breast: Secondary | ICD-10-CM | POA: Diagnosis not present

## 2017-12-01 ENCOUNTER — Ambulatory Visit: Payer: BLUE CROSS/BLUE SHIELD | Admitting: Obstetrics and Gynecology

## 2017-12-01 DIAGNOSIS — H00025 Hordeolum internum left lower eyelid: Secondary | ICD-10-CM | POA: Diagnosis not present

## 2017-12-18 ENCOUNTER — Encounter: Payer: Self-pay | Admitting: Obstetrics and Gynecology

## 2017-12-18 ENCOUNTER — Ambulatory Visit (INDEPENDENT_AMBULATORY_CARE_PROVIDER_SITE_OTHER): Payer: BLUE CROSS/BLUE SHIELD | Admitting: Obstetrics and Gynecology

## 2017-12-18 ENCOUNTER — Other Ambulatory Visit: Payer: Self-pay

## 2017-12-18 VITALS — BP 142/82 | HR 58 | Ht 64.0 in | Wt 239.0 lb

## 2017-12-18 DIAGNOSIS — K219 Gastro-esophageal reflux disease without esophagitis: Secondary | ICD-10-CM | POA: Insufficient documentation

## 2017-12-18 DIAGNOSIS — Z01419 Encounter for gynecological examination (general) (routine) without abnormal findings: Secondary | ICD-10-CM

## 2017-12-18 DIAGNOSIS — M25519 Pain in unspecified shoulder: Secondary | ICD-10-CM | POA: Insufficient documentation

## 2017-12-18 NOTE — Progress Notes (Signed)
52 y.o. G45P3003 Married Caucasian female here for annual exam.    Having hot flashes. These are improving.   Still with night time urination.  No leak with cough, laugh, or sneeze.   Saw cardiology regarding her venous stasis.  She was told to use compression hose.   Labs with PCP.  Will do January 11, 2018.   PCP:   Dr. Moreen Fowler  Patient's last menstrual period was 02/06/2014.           Sexually active: Yes.    The current method of family planning is status post hysterectomy.    Exercising: No.  The patient does not participate in regular exercise at present. Smoker:  no  Health Maintenance: Pap:  2017 normal History of abnormal Pap:  Yes, 2012 abnormal pap but follow up paps were normal.  Hysterectomy with normal cervix on final pathology. MMG:  10/31/2017 BI-RADS CATEGORY  1: Negative. Colonoscopy:  10/2016 normal with diverticulosis - [facility];next 7-8 years due to family history of brother with multiple polyps. BMD:   n/a  Result  n/a TDaP: 2016 Gardasil:   no XBJ:YNWGN Hep C:never Screening Labs:   PCP.   reports that she quit smoking about 26 years ago. Her smoking use included cigarettes. She quit after 3.00 years of use. She has never used smokeless tobacco. She reports that she drinks about 1.0 standard drinks of alcohol per week. She reports that she does not use drugs.  Past Medical History:  Diagnosis Date  . Asthma    no inhaler x 5 yrs  . Bulging lumbar disc   . Essential hypertension 01/17/2017  . Fibroid   . GERD (gastroesophageal reflux disease)   . History of degenerative disc disease   . Hyperlipidemia   . Hypertension 2013   no meds x 2 yrs  . Morbid obesity ([place]) 01/17/2017  . Pure hypercholesterolemia 01/17/2017  . Stasis dermatitis 01/17/2017    Past Surgical History:  Procedure Laterality Date  . CESAREAN SECTION     x 3  . LAPAROSCOPIC ASSISTED VAGINAL HYSTERECTOMY N/A 03/05/2014   Procedure: LAPAROSCOPIC ASSISTED VAGINAL HYSTERECTOMY  bilateral Salpingectomy;  Surgeon: Eldred Manges, MD;  Location: [facility];  Service: Gynecology;  Laterality: N/A;  vaginal  . TONSILLECTOMY      Current Outpatient Medications  Medication Sig Dispense Refill  . amLODipine (NORVASC) 10 MG tablet Take 10 mg by mouth daily.    Marland Kitchen aspirin 81 MG tablet Take 81 mg by mouth daily.    Marland Kitchen atorvastatin (LIPITOR) 10 MG tablet Take 10 mg by mouth daily.    Marland Kitchen ibuprofen (ADVIL,MOTRIN) 600 MG tablet 1  po  pc every 6 hours for 5 days then prn-pain 30 tablet 1  . L-Lysine 500 MG TABS Take 500 mg by mouth.    . lansoprazole (PREVACID) 15 MG capsule Take 15 mg by mouth daily at 12 noon.    Marland Kitchen losartan (COZAAR) 50 MG tablet Take 50 mg by mouth daily.    . Multiple Vitamin (MULTIVITAMIN) tablet Take 1 tablet by mouth daily.    . Omega-3 Fatty Acids (MINI FISH OIL PO) Take 1,290 mg by mouth.    . TURMERIC PO Take 1 capsule by mouth as needed.    . vitamin E 1000 UNIT capsule Take 1,000 Units by mouth daily.     No current facility-administered medications for this visit.     Family History  Problem Relation Age of Onset  . Diabetes Mother   .  Endometrial cancer Mother   . Ovarian cancer Mother   . Hypertension Mother   . Hyperlipidemia Mother   . Stroke Father   . Hyperlipidemia Father   . Hypertension Father   . Cancer Father        lung  . Fibroids Sister        sarcoma.    Marland Kitchen Epilepsy Brother   . Hypertension Brother   . Hyperlipidemia Brother   . Arthritis Brother   . Alzheimer's disease Maternal Grandmother   . Diabetes Maternal Grandfather   . Stomach cancer Paternal Grandmother   . Cancer Paternal Grandfather   . Brain cancer Paternal Aunt     Review of Systems  Constitutional: Negative.   HENT: Negative.   Eyes: Negative.   Respiratory: Negative.   Cardiovascular: Negative.   Gastrointestinal: Negative.   Endocrine: Negative.   Genitourinary: Negative.   Musculoskeletal: Positive for joint swelling.  Skin: Negative.    Allergic/Immunologic: Negative.   Neurological: Negative.   Hematological: Negative.   Psychiatric/Behavioral: Negative.   All other systems reviewed and are negative.   Exam:   BP (!) 142/82   Pulse (!) 58   Ht 5\' 4"  (1.626 m)   Wt 239 lb (108.4 kg)   LMP 02/06/2014   BMI 41.02 kg/m     General appearance: alert, cooperative and appears stated age Head: Normocephalic, without obvious abnormality, atraumatic Neck: no adenopathy, supple, symmetrical, trachea midline and thyroid normal to inspection and palpation Lungs: clear to auscultation bilaterally Breasts: normal appearance, no masses or tenderness, No nipple retraction or dimpling, No nipple discharge or bleeding, No axillary or supraclavicular adenopathy Heart: regular rate and rhythm Abdomen: soft, non-tender; no masses, no organomegaly Extremities: extremities normal, atraumatic, no cyanosis or edema.  [place] pigmentation of bilateral lower extremity skin.  Skin: Skin color, texture, turgor normal. No rashes or lesions Lymph nodes: Cervical, supraclavicular, and axillary nodes normal. No abnormal inguinal nodes palpated Neurologic: Grossly normal  Pelvic: External genitalia:  no lesions              Urethra:  normal appearing urethra with no masses, tenderness or lesions              Bartholins and Skenes: normal                 Vagina: normal appearing vagina with normal color and discharge, no lesions              Cervix:  absent              Pap taken: No. Bimanual Exam:  Uterus:  absent              Adnexa: no mass, fullness, tenderness              Rectal exam: Yes.  .  Confirms.              Anus:  normal sphincter tone, no lesions  Chaperone was present for exam.  Assessment:   Well woman visit with normal exam. Status post LAVH/bilateral salpingectomy.  Ovaries remain.  Hx abnormal pap.  Final cervical pathology from hysterectomy was normal. FH of uterine, ovarian, and sarcoma cancers.  Declines genetic  testing.  Venous stasis of lower extremities. Mild GSI. Menopausal symptoms.  Improved. FH stroke.  Plan: Mammogram screening. Recommended self breast awareness. Pap and HR HPV as above. Guidelines for Calcium, Vitamin D, regular exercise program including cardiovascular and weight bearing exercise. Declines genetic counseling/testing.  Follow up annually and prn.   After visit summary provided.

## 2017-12-18 NOTE — Patient Instructions (Signed)

## 2018-01-11 DIAGNOSIS — R7303 Prediabetes: Secondary | ICD-10-CM | POA: Diagnosis not present

## 2018-01-11 DIAGNOSIS — I10 Essential (primary) hypertension: Secondary | ICD-10-CM | POA: Diagnosis not present

## 2018-01-11 DIAGNOSIS — Z23 Encounter for immunization: Secondary | ICD-10-CM | POA: Diagnosis not present

## 2018-01-11 DIAGNOSIS — E782 Mixed hyperlipidemia: Secondary | ICD-10-CM | POA: Diagnosis not present

## 2018-01-11 DIAGNOSIS — K219 Gastro-esophageal reflux disease without esophagitis: Secondary | ICD-10-CM | POA: Diagnosis not present

## 2018-12-20 ENCOUNTER — Ambulatory Visit: Payer: BLUE CROSS/BLUE SHIELD | Admitting: Obstetrics and Gynecology

## 2019-01-17 ENCOUNTER — Other Ambulatory Visit: Payer: Self-pay | Admitting: Obstetrics and Gynecology

## 2019-01-17 DIAGNOSIS — Z1231 Encounter for screening mammogram for malignant neoplasm of breast: Secondary | ICD-10-CM

## 2019-01-22 ENCOUNTER — Ambulatory Visit (INDEPENDENT_AMBULATORY_CARE_PROVIDER_SITE_OTHER): Payer: 59 | Admitting: Obstetrics and Gynecology

## 2019-01-22 ENCOUNTER — Encounter: Payer: Self-pay | Admitting: Obstetrics and Gynecology

## 2019-01-22 ENCOUNTER — Other Ambulatory Visit: Payer: Self-pay

## 2019-01-22 VITALS — BP 124/80 | Temp 97.3°F | Ht 64.0 in | Wt 225.4 lb

## 2019-01-22 DIAGNOSIS — Z01419 Encounter for gynecological examination (general) (routine) without abnormal findings: Secondary | ICD-10-CM | POA: Diagnosis not present

## 2019-01-22 NOTE — Patient Instructions (Signed)

## 2019-01-22 NOTE — Progress Notes (Signed)
53 y.o. G75P3003 Married Caucasian female here for annual exam.    Menopausal is going ok.  Bladder and bowel control are good.  Denies pelvic pain.   PCP:  Antony Contras, MD   Patient's last menstrual period was 02/06/2014.           Sexually active: Yes.    The current method of family planning is status post hysterectomy.    Exercising: No.  The patient does not participate in regular exercise at present. Smoker:  no  Health Maintenance: Pap: 2017 normal, 04-02-12 Neg:Neg HR HPV History of abnormal Pap:  Yes, 2012 abnormal pap but follow up paps were normal.  Hysterectomy with normal cervix on final pathology MMG: 10-31-17 3D/Neg/density B/BiRads1--appt. 03-08-19 Colonoscopy: 10/2016 normal with diverticulosis - [facility];next 7-8 years due to family history of brother with multiple polyps.  BMD:   n/a  Result  n/a TDaP:  2016 Gardasil:   no HIV:no Hep C:no Screening Labs:  PCP. Flu vaccine:  Completed.    reports that she quit smoking about 27 years ago. Her smoking use included cigarettes. She quit after 3.00 years of use. She has never used smokeless tobacco. She reports current alcohol use. She reports that she does not use drugs.  Past Medical History:  Diagnosis Date  . Asthma    no inhaler x 5 yrs  . Bulging lumbar disc   . Essential hypertension 01/17/2017  . Fibroid   . GERD (gastroesophageal reflux disease)   . History of degenerative disc disease   . Hyperlipidemia   . Hypertension 2013   no meds x 2 yrs  . Morbid obesity ([place]) 01/17/2017  . Pure hypercholesterolemia 01/17/2017  . Stasis dermatitis 01/17/2017    Past Surgical History:  Procedure Laterality Date  . CESAREAN SECTION     x 3  . LAPAROSCOPIC ASSISTED VAGINAL HYSTERECTOMY N/A 03/05/2014   Procedure: LAPAROSCOPIC ASSISTED VAGINAL HYSTERECTOMY bilateral Salpingectomy;  Surgeon: Eldred Manges, MD;  Location: [facility];  Service: Gynecology;  Laterality: N/A;  vaginal  . TONSILLECTOMY       Current Outpatient Medications  Medication Sig Dispense Refill  . amLODipine (NORVASC) 10 MG tablet Take 10 mg by mouth daily.    Marland Kitchen aspirin 81 MG tablet Take 81 mg by mouth daily.    Marland Kitchen atorvastatin (LIPITOR) 10 MG tablet Take 10 mg by mouth daily.    Marland Kitchen ibuprofen (ADVIL,MOTRIN) 600 MG tablet 1  po  pc every 6 hours for 5 days then prn-pain 30 tablet 1  . L-Lysine 500 MG TABS Take 500 mg by mouth.    . lansoprazole (PREVACID) 15 MG capsule Take 15 mg by mouth daily at 12 noon.    Marland Kitchen losartan (COZAAR) 50 MG tablet Take 50 mg by mouth daily.    . Multiple Vitamin (MULTIVITAMIN) tablet Take 1 tablet by mouth daily.    . Omega-3 Fatty Acids (MINI FISH OIL PO) Take 1,290 mg by mouth.    . TURMERIC PO Take 1 capsule by mouth as needed.    . vitamin E 1000 UNIT capsule Take 1,000 Units by mouth daily.     No current facility-administered medications for this visit.     Family History  Problem Relation Age of Onset  . Diabetes Mother   . Endometrial cancer Mother   . Ovarian cancer Mother   . Hypertension Mother   . Hyperlipidemia Mother   . Stroke Father   . Hyperlipidemia Father   . Hypertension Father   .  Cancer Father        lung  . Fibroids Sister        sarcoma.    Marland Kitchen Epilepsy Brother   . Hypertension Brother   . Hyperlipidemia Brother   . Arthritis Brother   . Alzheimer's disease Maternal Grandmother   . Diabetes Maternal Grandfather   . Stomach cancer Paternal Grandmother   . Cancer Paternal Grandfather   . Brain cancer Paternal Aunt     Review of Systems  All other systems reviewed and are negative.   Exam:   BP 124/80 (Cuff Size: Large)   Temp (!) 97.3 F (36.3 C)   Ht 5\' 4"  (1.626 m)   Wt 225 lb 6.4 oz (102.2 kg)   LMP 02/06/2014   BMI 38.69 kg/m     General appearance: alert, cooperative and appears stated age Head: normocephalic, without obvious abnormality, atraumatic Neck: no adenopathy, supple, symmetrical, trachea midline and thyroid normal to  inspection and palpation Lungs: clear to auscultation bilaterally Breasts: normal appearance, no masses or tenderness, No nipple retraction or dimpling, No nipple discharge or bleeding, No axillary adenopathy Heart: regular rate and rhythm Abdomen: soft, non-tender; no masses, no organomegaly Extremities: extremities normal, atraumatic, no cyanosis or edema Skin: skin color, texture, turgor normal. No rashes or lesions Lymph nodes: cervical, supraclavicular, and axillary nodes normal. Neurologic: grossly normal  Pelvic: External genitalia:  no lesions              No abnormal inguinal nodes palpated.              Urethra:  normal appearing urethra with no masses, tenderness or lesions              Bartholins and Skenes: normal                 Vagina: normal appearing vagina with normal color and discharge, no lesions.  Good support.              Cervix:  Absent.              Pap taken: No. Bimanual Exam:  Uterus:   absent              Adnexa: no mass, fullness, tenderness              Rectal exam: Yes.  .  Confirms.              Anus:  normal sphincter tone, no lesions  Chaperone was present for exam.  Assessment:   Well woman visit with normal exam. Status post LAVH/bilateral salpingectomy.  Ovaries remain.  Hx abnormal pap.  Final cervical pathology from hysterectomy was normal. FH of uterine, ovarian, and sarcoma cancers.  Declines genetic testing.  Venous stasis of lower extremities. Mild GSI. Menopausal symptoms.  Improved. FH stroke.  Plan: Mammogram screening discussed. Self breast awareness reviewed. Pap and HR HPV as above. Guidelines for Calcium, Vitamin D, regular exercise program including cardiovascular and weight bearing exercise.   Follow up annually and prn.   After visit summary provided.

## 2019-03-08 ENCOUNTER — Other Ambulatory Visit: Payer: Self-pay

## 2019-03-08 ENCOUNTER — Ambulatory Visit
Admission: RE | Admit: 2019-03-08 | Discharge: 2019-03-08 | Disposition: A | Discharge status: Home / Self Care | Payer: 59 | Source: Ambulatory Visit | Attending: Obstetrics and Gynecology | Admitting: Obstetrics and Gynecology

## 2019-03-08 DIAGNOSIS — Z1231 Encounter for screening mammogram for malignant neoplasm of breast: Secondary | ICD-10-CM

## 2019-07-26 DIAGNOSIS — M79641 Pain in right hand: Secondary | ICD-10-CM | POA: Diagnosis not present

## 2019-08-09 DIAGNOSIS — S62346A Nondisplaced fracture of base of fifth metacarpal bone, right hand, initial encounter for closed fracture: Secondary | ICD-10-CM | POA: Diagnosis not present

## 2019-08-09 DIAGNOSIS — M79641 Pain in right hand: Secondary | ICD-10-CM | POA: Diagnosis not present

## 2019-11-18 DIAGNOSIS — H52203 Unspecified astigmatism, bilateral: Secondary | ICD-10-CM | POA: Diagnosis not present

## 2019-11-18 DIAGNOSIS — H5213 Myopia, bilateral: Secondary | ICD-10-CM | POA: Diagnosis not present

## 2020-02-26 DIAGNOSIS — E782 Mixed hyperlipidemia: Secondary | ICD-10-CM | POA: Diagnosis not present

## 2020-02-26 DIAGNOSIS — J45909 Unspecified asthma, uncomplicated: Secondary | ICD-10-CM | POA: Diagnosis not present

## 2020-02-26 DIAGNOSIS — I10 Essential (primary) hypertension: Secondary | ICD-10-CM | POA: Diagnosis not present

## 2020-02-26 DIAGNOSIS — R7303 Prediabetes: Secondary | ICD-10-CM | POA: Diagnosis not present

## 2020-02-26 DIAGNOSIS — K219 Gastro-esophageal reflux disease without esophagitis: Secondary | ICD-10-CM | POA: Diagnosis not present

## 2020-04-08 ENCOUNTER — Other Ambulatory Visit: Payer: Self-pay | Admitting: Obstetrics and Gynecology

## 2020-04-08 DIAGNOSIS — Z1231 Encounter for screening mammogram for malignant neoplasm of breast: Secondary | ICD-10-CM

## 2020-05-18 ENCOUNTER — Ambulatory Visit
Admission: RE | Admit: 2020-05-18 | Discharge: 2020-05-18 | Disposition: A | Discharge status: Home / Self Care | Payer: 59 | Source: Ambulatory Visit | Attending: Obstetrics and Gynecology | Admitting: Obstetrics and Gynecology

## 2020-05-18 ENCOUNTER — Other Ambulatory Visit: Payer: Self-pay

## 2020-05-18 DIAGNOSIS — Z1231 Encounter for screening mammogram for malignant neoplasm of breast: Secondary | ICD-10-CM

## 2020-05-22 ENCOUNTER — Other Ambulatory Visit: Payer: Self-pay | Admitting: Obstetrics and Gynecology

## 2020-05-22 DIAGNOSIS — R928 Other abnormal and inconclusive findings on diagnostic imaging of breast: Secondary | ICD-10-CM

## 2020-06-05 ENCOUNTER — Other Ambulatory Visit: Payer: Self-pay

## 2020-06-05 ENCOUNTER — Ambulatory Visit
Admission: RE | Admit: 2020-06-05 | Discharge: 2020-06-05 | Disposition: A | Discharge status: Home / Self Care | Payer: BC Managed Care – PPO | Source: Ambulatory Visit | Attending: Obstetrics and Gynecology | Admitting: Obstetrics and Gynecology

## 2020-06-05 DIAGNOSIS — N6002 Solitary cyst of left breast: Secondary | ICD-10-CM | POA: Diagnosis not present

## 2020-06-05 DIAGNOSIS — R928 Other abnormal and inconclusive findings on diagnostic imaging of breast: Secondary | ICD-10-CM

## 2020-06-05 DIAGNOSIS — R922 Inconclusive mammogram: Secondary | ICD-10-CM | POA: Diagnosis not present

## 2021-03-12 ENCOUNTER — Other Ambulatory Visit: Payer: Self-pay | Admitting: Obstetrics and Gynecology

## 2021-03-12 DIAGNOSIS — N6002 Solitary cyst of left breast: Secondary | ICD-10-CM | POA: Diagnosis not present

## 2021-03-15 ENCOUNTER — Other Ambulatory Visit: Payer: Self-pay | Admitting: Obstetrics and Gynecology

## 2021-03-15 DIAGNOSIS — N644 Mastodynia: Secondary | ICD-10-CM

## 2021-04-06 DIAGNOSIS — R7303 Prediabetes: Secondary | ICD-10-CM | POA: Diagnosis not present

## 2021-04-06 DIAGNOSIS — K219 Gastro-esophageal reflux disease without esophagitis: Secondary | ICD-10-CM | POA: Diagnosis not present

## 2021-04-06 DIAGNOSIS — Z23 Encounter for immunization: Secondary | ICD-10-CM | POA: Diagnosis not present

## 2021-04-06 DIAGNOSIS — I10 Essential (primary) hypertension: Secondary | ICD-10-CM | POA: Diagnosis not present

## 2021-04-06 DIAGNOSIS — E782 Mixed hyperlipidemia: Secondary | ICD-10-CM | POA: Diagnosis not present

## 2021-04-13 ENCOUNTER — Other Ambulatory Visit: Payer: BC Managed Care – PPO

## 2021-04-14 ENCOUNTER — Ambulatory Visit
Admission: RE | Admit: 2021-04-14 | Discharge: 2021-04-14 | Disposition: A | Discharge status: Home / Self Care | Payer: BC Managed Care – PPO | Source: Ambulatory Visit | Attending: Obstetrics and Gynecology | Admitting: Obstetrics and Gynecology

## 2021-04-14 ENCOUNTER — Ambulatory Visit: Payer: BC Managed Care – PPO

## 2021-04-14 DIAGNOSIS — R922 Inconclusive mammogram: Secondary | ICD-10-CM | POA: Diagnosis not present

## 2021-04-14 DIAGNOSIS — N644 Mastodynia: Secondary | ICD-10-CM

## 2021-05-06 DIAGNOSIS — Z01419 Encounter for gynecological examination (general) (routine) without abnormal findings: Secondary | ICD-10-CM | POA: Diagnosis not present

## 2021-05-06 DIAGNOSIS — Z6841 Body Mass Index (BMI) 40.0 and over, adult: Secondary | ICD-10-CM | POA: Diagnosis not present

## 2021-07-06 ENCOUNTER — Other Ambulatory Visit: Payer: Self-pay | Admitting: Family Medicine

## 2021-07-06 ENCOUNTER — Other Ambulatory Visit: Payer: Self-pay | Admitting: Obstetrics and Gynecology

## 2021-07-06 DIAGNOSIS — Z1231 Encounter for screening mammogram for malignant neoplasm of breast: Secondary | ICD-10-CM

## 2021-07-06 DIAGNOSIS — D229 Melanocytic nevi, unspecified: Secondary | ICD-10-CM | POA: Diagnosis not present

## 2021-07-28 ENCOUNTER — Ambulatory Visit
Admission: RE | Admit: 2021-07-28 | Discharge: 2021-07-28 | Disposition: A | Discharge status: Home / Self Care | Payer: BC Managed Care – PPO | Source: Ambulatory Visit | Attending: Family Medicine | Admitting: Family Medicine

## 2021-07-28 DIAGNOSIS — Z1231 Encounter for screening mammogram for malignant neoplasm of breast: Secondary | ICD-10-CM

## 2021-08-09 DIAGNOSIS — D2261 Melanocytic nevi of right upper limb, including shoulder: Secondary | ICD-10-CM | POA: Diagnosis not present

## 2021-08-09 DIAGNOSIS — L570 Actinic keratosis: Secondary | ICD-10-CM | POA: Diagnosis not present

## 2021-08-09 DIAGNOSIS — D225 Melanocytic nevi of trunk: Secondary | ICD-10-CM | POA: Diagnosis not present

## 2021-08-09 DIAGNOSIS — L821 Other seborrheic keratosis: Secondary | ICD-10-CM | POA: Diagnosis not present

## 2021-09-28 DIAGNOSIS — E782 Mixed hyperlipidemia: Secondary | ICD-10-CM | POA: Diagnosis not present

## 2021-09-28 DIAGNOSIS — I10 Essential (primary) hypertension: Secondary | ICD-10-CM | POA: Diagnosis not present

## 2021-09-28 DIAGNOSIS — K219 Gastro-esophageal reflux disease without esophagitis: Secondary | ICD-10-CM | POA: Diagnosis not present

## 2021-09-28 DIAGNOSIS — Z0000 Encounter for general adult medical examination without abnormal findings: Secondary | ICD-10-CM | POA: Diagnosis not present

## 2021-09-28 DIAGNOSIS — L309 Dermatitis, unspecified: Secondary | ICD-10-CM | POA: Diagnosis not present

## 2021-09-28 DIAGNOSIS — R7303 Prediabetes: Secondary | ICD-10-CM | POA: Diagnosis not present

## 2021-12-07 DIAGNOSIS — D2372 Other benign neoplasm of skin of left lower limb, including hip: Secondary | ICD-10-CM | POA: Diagnosis not present

## 2021-12-07 DIAGNOSIS — L810 Postinflammatory hyperpigmentation: Secondary | ICD-10-CM | POA: Diagnosis not present

## 2021-12-07 DIAGNOSIS — R21 Rash and other nonspecific skin eruption: Secondary | ICD-10-CM | POA: Diagnosis not present

## 2022-03-25 DIAGNOSIS — E782 Mixed hyperlipidemia: Secondary | ICD-10-CM | POA: Diagnosis not present

## 2022-03-25 DIAGNOSIS — I10 Essential (primary) hypertension: Secondary | ICD-10-CM | POA: Diagnosis not present

## 2022-03-25 DIAGNOSIS — K219 Gastro-esophageal reflux disease without esophagitis: Secondary | ICD-10-CM | POA: Diagnosis not present

## 2022-03-25 DIAGNOSIS — R7303 Prediabetes: Secondary | ICD-10-CM | POA: Diagnosis not present

## 2022-07-19 ENCOUNTER — Other Ambulatory Visit: Payer: Self-pay | Admitting: Obstetrics and Gynecology

## 2022-07-19 DIAGNOSIS — Z1231 Encounter for screening mammogram for malignant neoplasm of breast: Secondary | ICD-10-CM

## 2022-08-25 ENCOUNTER — Ambulatory Visit
Admission: RE | Admit: 2022-08-25 | Discharge: 2022-08-25 | Disposition: A | Discharge status: Home / Self Care | Payer: BC Managed Care – PPO | Source: Ambulatory Visit | Attending: Obstetrics and Gynecology | Admitting: Obstetrics and Gynecology

## 2022-08-25 DIAGNOSIS — Z1231 Encounter for screening mammogram for malignant neoplasm of breast: Secondary | ICD-10-CM | POA: Diagnosis not present

## 2022-08-31 DIAGNOSIS — Z01419 Encounter for gynecological examination (general) (routine) without abnormal findings: Secondary | ICD-10-CM | POA: Diagnosis not present

## 2022-10-10 DIAGNOSIS — Z0000 Encounter for general adult medical examination without abnormal findings: Secondary | ICD-10-CM | POA: Diagnosis not present

## 2022-10-10 DIAGNOSIS — R7303 Prediabetes: Secondary | ICD-10-CM | POA: Diagnosis not present

## 2022-10-10 DIAGNOSIS — K219 Gastro-esophageal reflux disease without esophagitis: Secondary | ICD-10-CM | POA: Diagnosis not present

## 2022-10-10 DIAGNOSIS — E782 Mixed hyperlipidemia: Secondary | ICD-10-CM | POA: Diagnosis not present

## 2022-10-10 DIAGNOSIS — I10 Essential (primary) hypertension: Secondary | ICD-10-CM | POA: Diagnosis not present

## 2023-01-31 DIAGNOSIS — Z09 Encounter for follow-up examination after completed treatment for conditions other than malignant neoplasm: Secondary | ICD-10-CM | POA: Diagnosis not present

## 2023-01-31 DIAGNOSIS — D122 Benign neoplasm of ascending colon: Secondary | ICD-10-CM | POA: Diagnosis not present

## 2023-01-31 DIAGNOSIS — K5730 Diverticulosis of large intestine without perforation or abscess without bleeding: Secondary | ICD-10-CM | POA: Diagnosis not present

## 2023-01-31 DIAGNOSIS — Z83719 Family history of colon polyps, unspecified: Secondary | ICD-10-CM | POA: Diagnosis not present

## 2023-02-01 DIAGNOSIS — D225 Melanocytic nevi of trunk: Secondary | ICD-10-CM | POA: Diagnosis not present

## 2023-02-01 DIAGNOSIS — D2261 Melanocytic nevi of right upper limb, including shoulder: Secondary | ICD-10-CM | POA: Diagnosis not present

## 2023-02-01 DIAGNOSIS — D2262 Melanocytic nevi of left upper limb, including shoulder: Secondary | ICD-10-CM | POA: Diagnosis not present

## 2023-02-01 DIAGNOSIS — L821 Other seborrheic keratosis: Secondary | ICD-10-CM | POA: Diagnosis not present

## 2023-04-14 DIAGNOSIS — E782 Mixed hyperlipidemia: Secondary | ICD-10-CM | POA: Diagnosis not present

## 2023-04-14 DIAGNOSIS — R7303 Prediabetes: Secondary | ICD-10-CM | POA: Diagnosis not present

## 2023-04-14 DIAGNOSIS — I10 Essential (primary) hypertension: Secondary | ICD-10-CM | POA: Diagnosis not present

## 2023-04-14 DIAGNOSIS — K219 Gastro-esophageal reflux disease without esophagitis: Secondary | ICD-10-CM | POA: Diagnosis not present

## 2023-08-03 ENCOUNTER — Other Ambulatory Visit: Payer: Self-pay | Admitting: Obstetrics and Gynecology

## 2023-08-03 DIAGNOSIS — Z1231 Encounter for screening mammogram for malignant neoplasm of breast: Secondary | ICD-10-CM

## 2023-08-29 ENCOUNTER — Ambulatory Visit
Admission: RE | Admit: 2023-08-29 | Discharge: 2023-08-29 | Disposition: A | Discharge status: Home / Self Care | Source: Ambulatory Visit | Attending: Obstetrics and Gynecology

## 2023-08-29 DIAGNOSIS — Z1231 Encounter for screening mammogram for malignant neoplasm of breast: Secondary | ICD-10-CM | POA: Diagnosis not present

## 2023-09-01 ENCOUNTER — Other Ambulatory Visit: Payer: Self-pay | Admitting: Obstetrics and Gynecology

## 2023-09-01 DIAGNOSIS — R928 Other abnormal and inconclusive findings on diagnostic imaging of breast: Secondary | ICD-10-CM

## 2023-09-05 ENCOUNTER — Ambulatory Visit
Admission: RE | Admit: 2023-09-05 | Discharge: 2023-09-05 | Disposition: A | Discharge status: Home / Self Care | Source: Ambulatory Visit | Attending: Obstetrics and Gynecology

## 2023-09-05 ENCOUNTER — Ambulatory Visit

## 2023-09-05 DIAGNOSIS — R928 Other abnormal and inconclusive findings on diagnostic imaging of breast: Secondary | ICD-10-CM

## 2023-09-06 DIAGNOSIS — Z6841 Body Mass Index (BMI) 40.0 and over, adult: Secondary | ICD-10-CM | POA: Diagnosis not present

## 2023-09-06 DIAGNOSIS — Z01419 Encounter for gynecological examination (general) (routine) without abnormal findings: Secondary | ICD-10-CM | POA: Diagnosis not present

## 2023-09-07 ENCOUNTER — Encounter

## 2023-09-07 ENCOUNTER — Other Ambulatory Visit

## 2023-10-12 ENCOUNTER — Other Ambulatory Visit (HOSPITAL_BASED_OUTPATIENT_CLINIC_OR_DEPARTMENT_OTHER): Payer: Self-pay | Admitting: Family Medicine

## 2023-10-12 DIAGNOSIS — Z23 Encounter for immunization: Secondary | ICD-10-CM | POA: Diagnosis not present

## 2023-10-12 DIAGNOSIS — Z0000 Encounter for general adult medical examination without abnormal findings: Secondary | ICD-10-CM | POA: Diagnosis not present

## 2023-10-12 DIAGNOSIS — E782 Mixed hyperlipidemia: Secondary | ICD-10-CM

## 2023-10-12 DIAGNOSIS — R7303 Prediabetes: Secondary | ICD-10-CM | POA: Diagnosis not present

## 2023-10-12 DIAGNOSIS — I10 Essential (primary) hypertension: Secondary | ICD-10-CM | POA: Diagnosis not present

## 2023-10-12 DIAGNOSIS — K219 Gastro-esophageal reflux disease without esophagitis: Secondary | ICD-10-CM | POA: Diagnosis not present

## 2023-11-13 ENCOUNTER — Ambulatory Visit (HOSPITAL_BASED_OUTPATIENT_CLINIC_OR_DEPARTMENT_OTHER)
Admission: RE | Admit: 2023-11-13 | Discharge: 2023-11-13 | Disposition: A | Discharge status: Home / Self Care | Payer: Self-pay | Source: Ambulatory Visit | Attending: Family Medicine | Admitting: Family Medicine

## 2023-11-13 DIAGNOSIS — E782 Mixed hyperlipidemia: Secondary | ICD-10-CM | POA: Insufficient documentation

## 2023-12-22 DIAGNOSIS — H53143 Visual discomfort, bilateral: Secondary | ICD-10-CM | POA: Diagnosis not present

## 2023-12-22 DIAGNOSIS — H00015 Hordeolum externum left lower eyelid: Secondary | ICD-10-CM | POA: Diagnosis not present

## 2024-01-25 IMAGING — MG MM DIGITAL SCREENING BILAT W/ TOMO AND CAD
8 series · 8 of 24 positions shown · non-contrast
Comparison: Previous exam(s).

CLINICAL DATA: Screening.

EXAM:
DIGITAL SCREENING BILATERAL MAMMOGRAM WITH TOMOSYNTHESIS AND CAD
TECHNIQUE: Bilateral screening digital craniocaudal and mediolateral oblique
mammograms were obtained. Bilateral screening digital breast
tomosynthesis was performed. The images were evaluated with
computer-aided detection.

[R CC synth-2D]
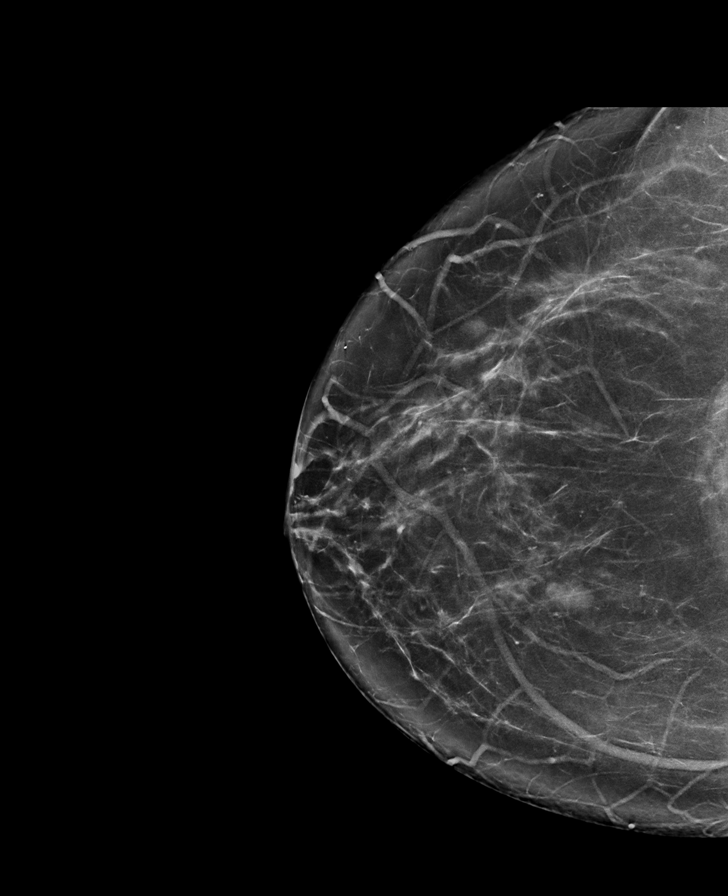

[L CC synth-2D]
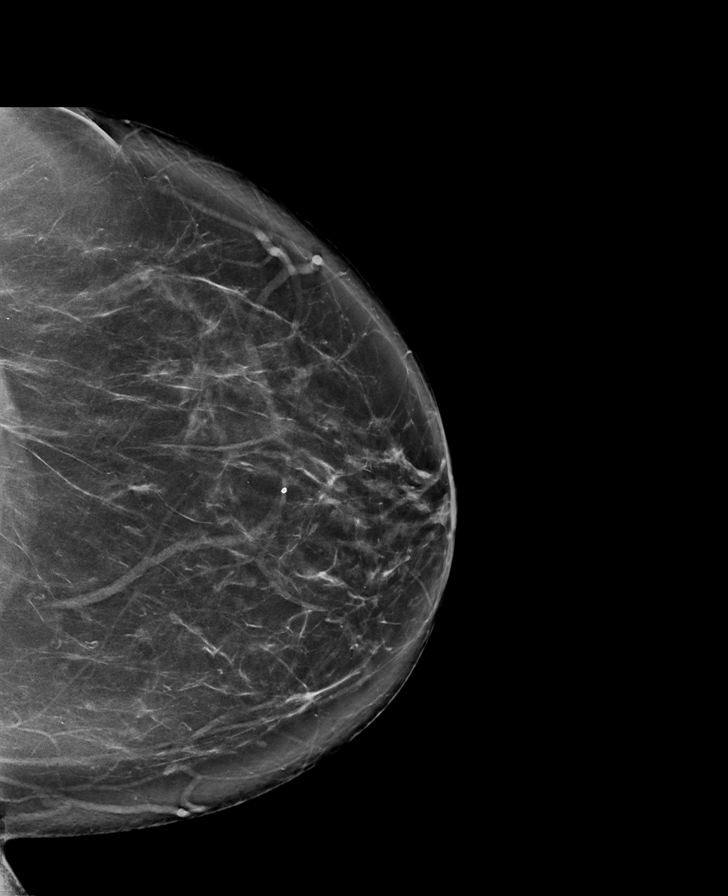

[L MLO synth-2D]
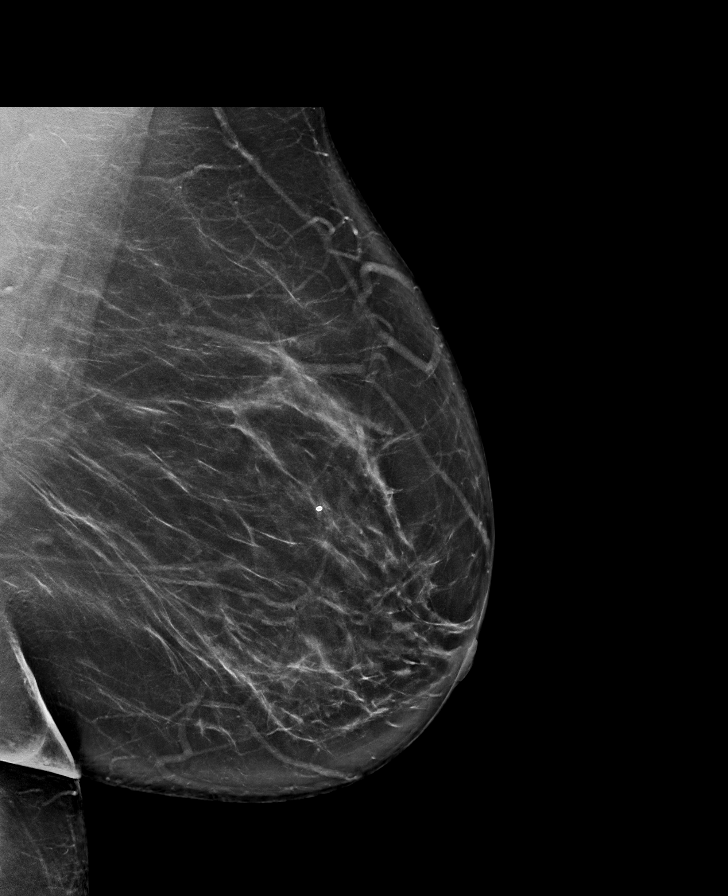

[R MLO synth-2D]
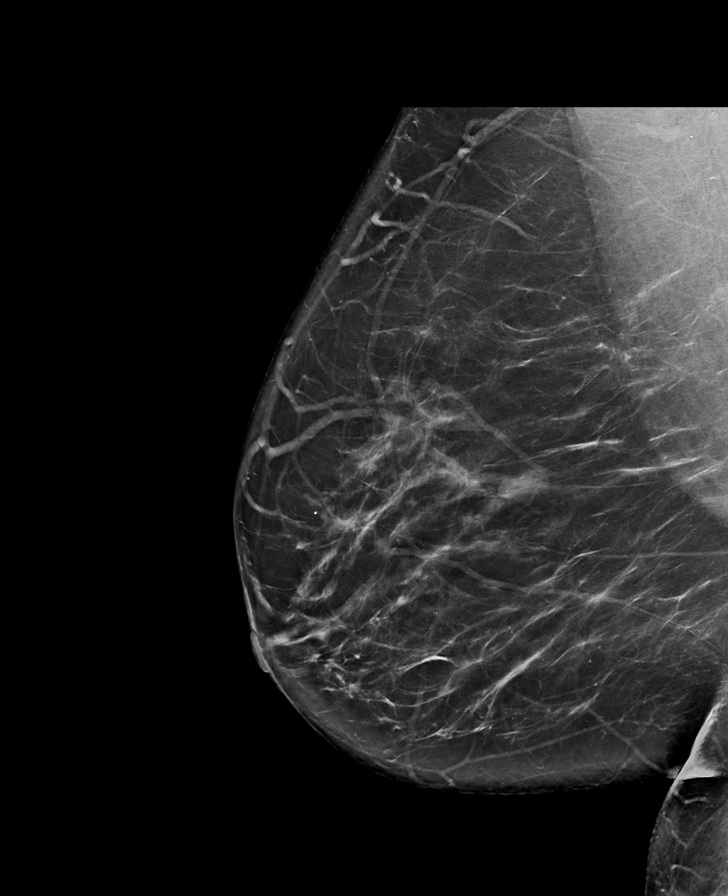

[L CC tomo · tomo slice 47/92.0]
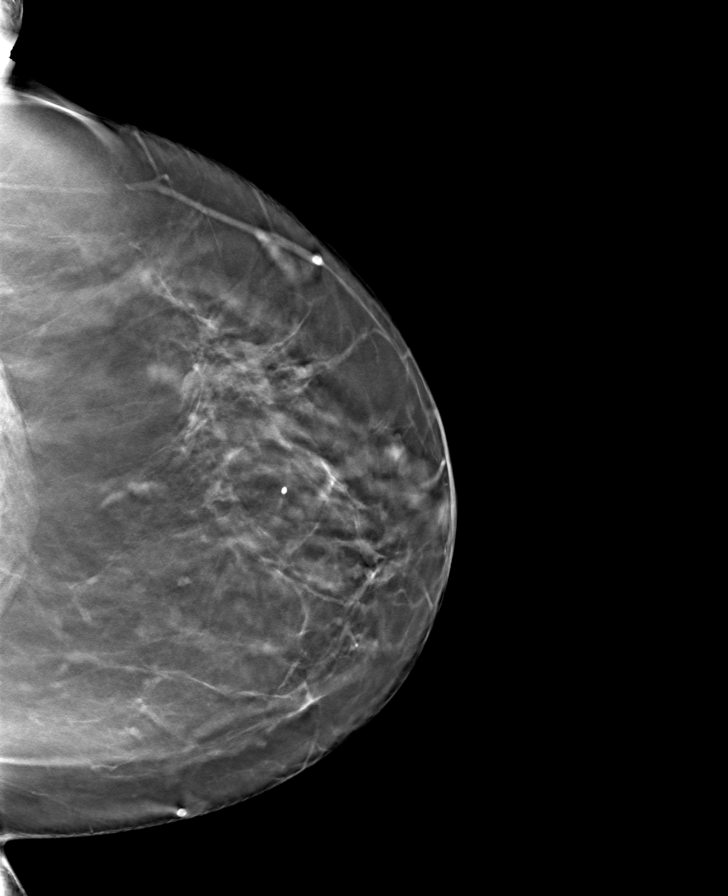

[R CC tomo · tomo slice 43/85.0]
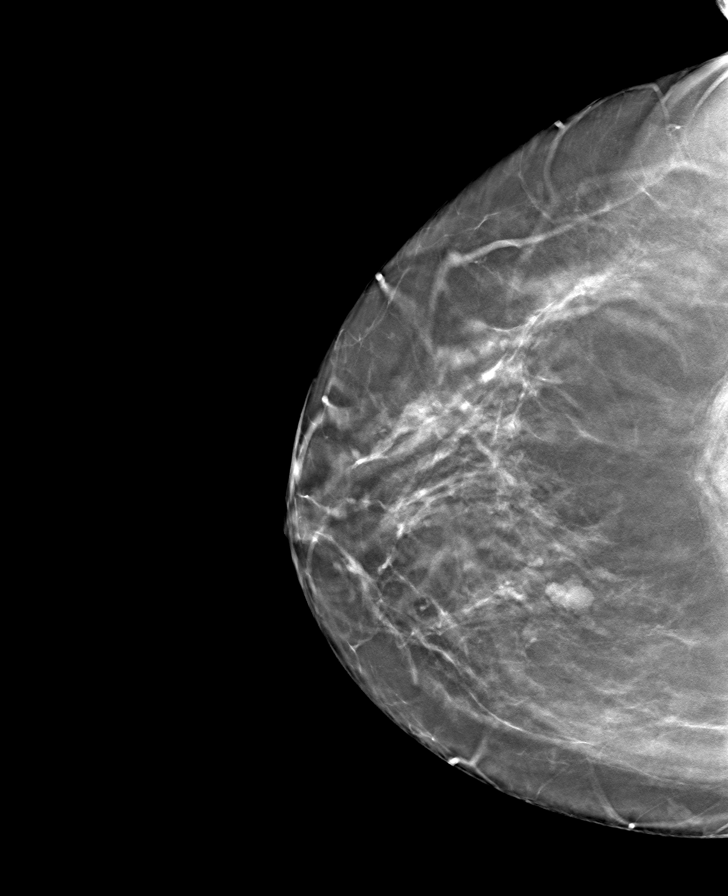

[L MLO tomo · tomo slice 48/95.0]
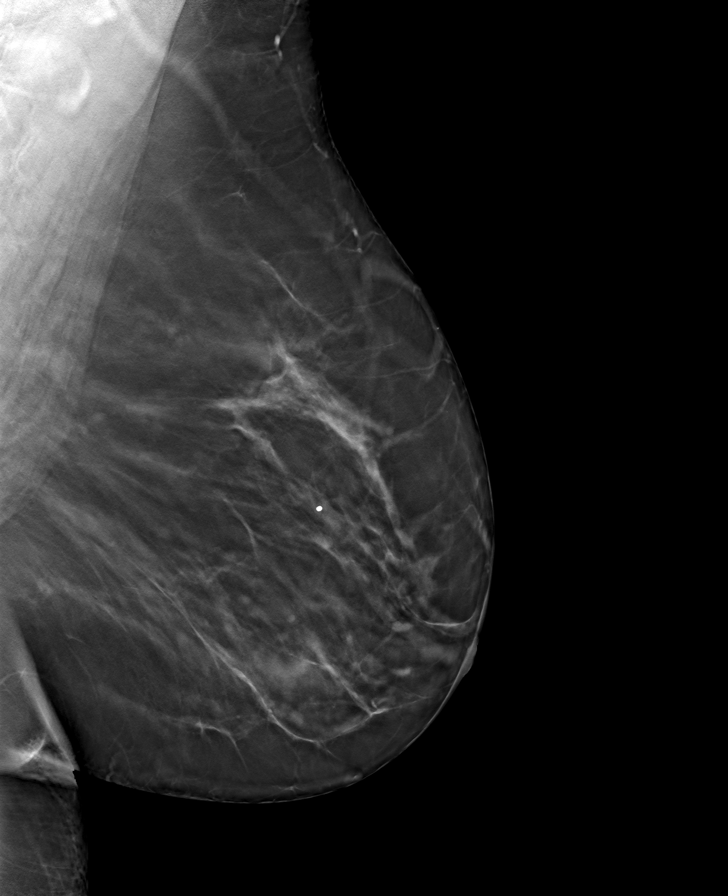

[R MLO tomo · tomo slice 44/87.0]
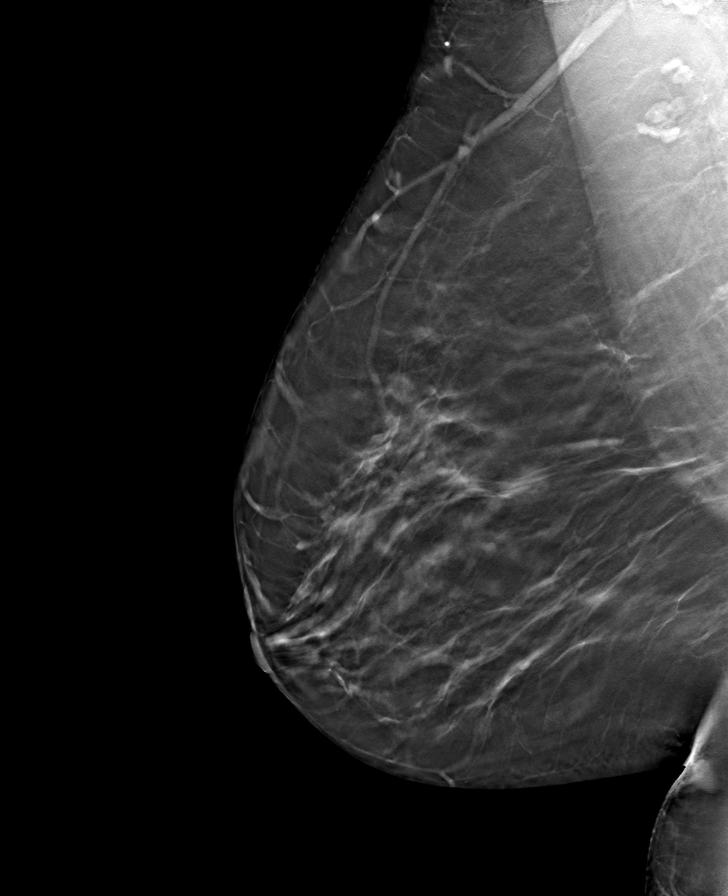

[8 of 24 positions shown; findings below may reference images not displayed]

ACR Breast Density Category b: There are scattered areas of
fibroglandular density.
FINDINGS: There are no findings suspicious for malignancy.
IMPRESSION: No mammographic evidence of malignancy. A result letter of this
screening mammogram will be mailed directly to the patient.

RECOMMENDATION:
Screening mammogram in one year. (Code:51-O-LD2)

BI-RADS CATEGORY  1: Negative.
